# Patient Record
Sex: Male | Born: 1985
Health system: Southern US, Community
[De-identification: ages and names within clinical notes are randomized; demographics above are authoritative.]

## PROBLEM LIST (undated history)

## (undated) DIAGNOSIS — F32A Depression, unspecified: Secondary | ICD-10-CM

## (undated) DIAGNOSIS — J342 Deviated nasal septum: Secondary | ICD-10-CM

## (undated) DIAGNOSIS — F329 Major depressive disorder, single episode, unspecified: Secondary | ICD-10-CM

---
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
## 2006-04-10 ENCOUNTER — Ambulatory Visit (HOSPITAL_COMMUNITY): Admission: RE | Admit: 2006-04-10 | Discharge: 2006-04-10 | Payer: Self-pay | Admitting: Family Medicine

## 2013-03-02 ENCOUNTER — Telehealth: Payer: Self-pay | Admitting: Family Medicine

## 2013-03-02 MED ORDER — BUPROPION HCL ER (XL) 150 MG PO TB24: 300.0000 mg | ORAL_TABLET | Freq: Every day | ORAL | Status: DC

## 2013-03-02 NOTE — Telephone Encounter (Signed)
Patient needs a refill of his wellbutrin to [facility]

## 2013-06-05 ENCOUNTER — Other Ambulatory Visit: Payer: Self-pay | Admitting: Family Medicine

## 2013-06-14 ENCOUNTER — Encounter: Payer: Self-pay | Admitting: *Deleted

## 2013-06-16 ENCOUNTER — Ambulatory Visit (INDEPENDENT_AMBULATORY_CARE_PROVIDER_SITE_OTHER): Payer: BC Managed Care – PPO | Admitting: Family Medicine

## 2013-06-16 ENCOUNTER — Encounter: Payer: Self-pay | Admitting: Family Medicine

## 2013-06-16 VITALS — BP 110/60 | HR 88 | Ht 67.5 in | Wt 119.0 lb

## 2013-06-16 DIAGNOSIS — R109 Unspecified abdominal pain: Secondary | ICD-10-CM

## 2013-06-16 MED ORDER — BUPROPION HCL ER (XL) 300 MG PO TB24: 300.0000 mg | ORAL_TABLET | ORAL | Status: DC

## 2013-06-16 NOTE — Progress Notes (Signed)
  Subjective:    Patient ID: Thomas Page, male    DOB: 1986-05-25, 27 y.o.   MRN: [id number]  HPI Needs a refill on med. No other concerns. He states the medicine does help him stay focused. He still smokes. He also drinks alcohol. He states at times he drinks 6-12 cans in a day. He denies driving drunk. He knows he needs to cut back. He refuses any counseling or refuses any referral. Patient does not want to be on any medications. He states he is not suicidal. He denies any severe abdominal discomforts vomiting or diarrhea or bloody stools or dysuria PMH benign   Review of Systems See above    Objective:   Physical Exam Lungs are clear hearts regular abdomen soft no masses felt pulses normal skin warm dry       Assessment & Plan:  #1 depression-stable continue current medication. Refills given #2 alcohol misuse/abuse-patient was told that he needs to consider a treatment program or counseling he does not want to do either. Patient was counseled that continuing on the current path could cause serious health problems. #3 we will check some lab work to look at liver function. Patient followup in 6-12 months

## 2013-06-16 NOTE — Patient Instructions (Addendum)
Reduce alcohol use

## 2013-07-08 LAB — HEPATIC FUNCTION PANEL
ALT: 21 U/L (ref 0–53)
Total Protein: 7.6 g/dL (ref 6.0–8.3)

## 2013-07-08 LAB — CBC WITH DIFFERENTIAL/PLATELET
Basophils Absolute: 0.1 10*3/uL (ref 0.0–0.1)
Basophils Relative: 2 % — ABNORMAL HIGH (ref 0–1)
Eosinophils Relative: 1 % (ref 0–5)
HCT: 45.7 % (ref 39.0–52.0)
MCHC: 35 g/dL (ref 30.0–36.0)
MCV: 92.3 fL (ref 78.0–100.0)
Monocytes Absolute: 0.4 10*3/uL (ref 0.1–1.0)
Neutro Abs: 1.7 10*3/uL (ref 1.7–7.7)
RDW: 13.5 % (ref 11.5–15.5)

## 2014-07-26 ENCOUNTER — Other Ambulatory Visit: Payer: Self-pay | Admitting: Family Medicine

## 2014-07-26 NOTE — Telephone Encounter (Signed)
1 refill, needs office visit 

## 2014-07-26 NOTE — Telephone Encounter (Signed)
Patient not seen in over a year.

## 2014-09-04 ENCOUNTER — Other Ambulatory Visit: Payer: Self-pay | Admitting: Family Medicine

## 2014-09-06 ENCOUNTER — Other Ambulatory Visit: Payer: Self-pay | Admitting: *Deleted

## 2014-09-21 ENCOUNTER — Encounter: Payer: Self-pay | Admitting: Family Medicine

## 2014-09-21 ENCOUNTER — Ambulatory Visit (INDEPENDENT_AMBULATORY_CARE_PROVIDER_SITE_OTHER): Payer: BC Managed Care – PPO | Admitting: Family Medicine

## 2014-09-21 VITALS — BP 120/80 | Ht 67.5 in | Wt 118.1 lb

## 2014-09-21 DIAGNOSIS — M792 Neuralgia and neuritis, unspecified: Secondary | ICD-10-CM

## 2014-09-21 DIAGNOSIS — Z23 Encounter for immunization: Secondary | ICD-10-CM

## 2014-09-21 MED ORDER — BUPROPION HCL ER (XL) 300 MG PO TB24: ORAL_TABLET | ORAL | Status: DC

## 2014-09-21 NOTE — Progress Notes (Signed)
   Subjective:    Patient ID: Thomas Page, male    DOB: 20-Oct-1986, 28 y.o.   MRN: [id number]  HPI Patient is here today for his follow up visit on depression. Patient currently taking Wellbutrin XL 300 mg daily. Patient states he is doing very well. Patient states that he has a shooting pain in his right hand at times. This has been present for about 2-3 months now.  States overall his stress levels are doing all right he states that his symptoms are hanging in there he denies being depressed currently he wants to continue the medication he states it helps him  He gets intermittent sharp shooting pains sharp pains in the right hand no known injury.  Review of Systems    denies chest pain shortness of breath abdominal pain vomiting diarrhea rectal bleeding Objective:   Physical Exam  On examination lungs clear heart regular neck no masses abdomen soft      Assessment & Plan:  Significant stress does well with Wellbutrin. Tolerates medicine well.  Alcohol use-I counseled him regarding cutting back alcohol use and trying to quit he does not drink every day but he needs to cut back the amount he drinks on those days  Follow up in one years time he was counseled to quit smoking  Sharp pains in the hands I think this is probably a neuralgia if he gets worse nerve conduction studies it only occurs intermittently no weakness in the arms reflexes good no other in intervention or tests necessary currently.

## 2015-05-10 ENCOUNTER — Emergency Department (HOSPITAL_COMMUNITY)
Admission: EM | Admit: 2015-05-10 | Discharge: 2015-05-10 | Disposition: A | Discharge status: Home / Self Care | Payer: 59 | Attending: Emergency Medicine | Admitting: Emergency Medicine

## 2015-05-10 ENCOUNTER — Encounter (HOSPITAL_COMMUNITY): Payer: Self-pay | Admitting: *Deleted

## 2015-05-10 DIAGNOSIS — Y9389 Activity, other specified: Secondary | ICD-10-CM | POA: Insufficient documentation

## 2015-05-10 DIAGNOSIS — T1592XA Foreign body on external eye, part unspecified, left eye, initial encounter: Secondary | ICD-10-CM | POA: Diagnosis present

## 2015-05-10 DIAGNOSIS — Y9289 Other specified places as the place of occurrence of the external cause: Secondary | ICD-10-CM | POA: Diagnosis not present

## 2015-05-10 DIAGNOSIS — X58XXXA Exposure to other specified factors, initial encounter: Secondary | ICD-10-CM | POA: Diagnosis not present

## 2015-05-10 DIAGNOSIS — Z720 Tobacco use: Secondary | ICD-10-CM | POA: Insufficient documentation

## 2015-05-10 DIAGNOSIS — F329 Major depressive disorder, single episode, unspecified: Secondary | ICD-10-CM | POA: Insufficient documentation

## 2015-05-10 DIAGNOSIS — Z880 Allergy status to penicillin: Secondary | ICD-10-CM | POA: Diagnosis not present

## 2015-05-10 DIAGNOSIS — Y998 Other external cause status: Secondary | ICD-10-CM | POA: Insufficient documentation

## 2015-05-10 HISTORY — DX: Major depressive disorder, single episode, unspecified: F32.9

## 2015-05-10 HISTORY — DX: Depression, unspecified: F32.A

## 2015-05-10 MED ORDER — TETRACAINE HCL 0.5 % OP SOLN
1.0000 [drp] | Freq: Once | OPHTHALMIC | Status: AC
  Administered 2015-05-10: 1 [drp] via OPHTHALMIC
  Filled 2015-05-10: qty 2

## 2015-05-10 MED ORDER — FLUORESCEIN SODIUM 1 MG OP STRP
1.0000 | ORAL_STRIP | Freq: Once | OPHTHALMIC | Status: AC
  Administered 2015-05-10: 1 via OPHTHALMIC

## 2015-05-10 MED ORDER — TOBRAMYCIN 0.3 % OP SOLN
1.0000 [drp] | Freq: Once | OPHTHALMIC | Status: AC
  Administered 2015-05-10: 1 [drp] via OPHTHALMIC
  Filled 2015-05-10: qty 5

## 2015-05-10 NOTE — Discharge Instructions (Signed)
Return as needed for worsening symptoms or follow up with Dr. Lita MainsHaines.

## 2015-05-10 NOTE — ED Provider Notes (Signed)
CSN: [id number]     Arrival date & time 05/10/15  2003 History   First MD Initiated Contact with Patient 05/10/15 2115     Chief Complaint  Patient presents with  . Foreign Body in Eye     (Consider location/radiation/quality/duration/timing/severity/associated sxs/prior Treatment) Patient is a 29 y.o. male presenting with foreign body in eye. The history is provided by the patient.  Foreign Body in Eye This is a new problem. The current episode started today. The problem occurs constantly. The problem has been unchanged.   Thomas Page is a 29 y.o. male who presents to the ED with left eye foreign body. He states that he was mowing and something flew up into his left eye. He tried flushing it out with water several times without relief. Patient reports that after arrival to the ED his eye was watering a lot and then it felt like what ever was in the eye came out and now he does not have pain but wanted to be sure he didn't have a scratch or anything in it now.   Past Medical History  Diagnosis Date  . Depression    No past surgical history on file. No family history on file. History  Substance Use Topics  . Smoking status: Light Tobacco Smoker  . Smokeless tobacco: Not on file  . Alcohol Use: Yes    Review of Systems Negative except as stated in HPI   Allergies  Amoxil and Penicillins  Home Medications   Prior to Admission medications   Medication Sig Start Date End Date Taking? Authorizing Provider  buPROPion (WELLBUTRIN XL) 300 MG 24 hr tablet TAKE 1 TABLET BY MOUTH EVERY MORNING. 09/21/14   Babs SciaraScott A Luking, MD   BP 157/88 mmHg  Pulse 67  Temp(Src) 98.2 F (36.8 C) (Oral)  Resp 15  Ht 5\' 9"  (1.753 m)  Wt 120 lb (54.432 kg)  BMI 17.71 kg/m2  SpO2 99% Physical Exam  Constitutional: He is oriented to person, place, and time. He appears well-developed and well-nourished.  HENT:  Head: Normocephalic.  Eyes: EOM are normal. Pupils are equal, round, and reactive  to light. Left eye exhibits no discharge and no exudate. No foreign body present in the left eye. Left conjunctiva is injected.  Fundoscopic exam:      The left eye shows no exudate.  Slit lamp exam:      The left eye shows no corneal abrasion, no corneal ulcer, no foreign body and no fluorescein uptake.  Neck: Neck supple.  Cardiovascular: Normal rate.   Pulmonary/Chest: Effort normal.  Musculoskeletal: Normal range of motion.  Neurological: He is alert and oriented to person, place, and time. No cranial nerve deficit.  Skin: Skin is warm and dry.  Psychiatric: He has a normal mood and affect. His behavior is normal.  Nursing note and vitals reviewed.   ED Course  Procedures (including critical care time) Tetracaine opth, visual acuity, slit lamp exam, eye stained, tobramycin opth. Drops.  Labs Review  MDM  29 y.o. male with left eye irritation after something blew in his eye while mowing. Stable for d/c with symptoms resolving after his eye started watering a lot. No foreign body visualized and no corneal abrasion identified on exam. Patient to follow up with Dr. Lita MainsHaines if symptoms return.   Final diagnoses:  Foreign body of left eye, initial encounter      [facility] Rollinson, NP 05/10/15 2139  Zadie Rhineonald Wickline, MD 05/11/15 [id number]

## 2015-05-10 NOTE — ED Notes (Signed)
Pt states he was mowing and something flew up in his left eye. Pt state he has flushed this eye multiple times with no relief.

## 2015-10-18 ENCOUNTER — Other Ambulatory Visit: Payer: Self-pay | Admitting: Family Medicine

## 2015-10-19 NOTE — Telephone Encounter (Signed)
May have this and one refill please send patient a card he needs office visit

## 2015-10-20 ENCOUNTER — Other Ambulatory Visit: Payer: Self-pay

## 2015-11-23 ENCOUNTER — Other Ambulatory Visit: Payer: Self-pay | Admitting: Family Medicine

## 2015-11-23 NOTE — Telephone Encounter (Signed)
This and one refill, patient needs office visit

## 2016-02-08 ENCOUNTER — Other Ambulatory Visit: Payer: Self-pay | Admitting: Family Medicine

## 2016-02-09 NOTE — Telephone Encounter (Signed)
Not seen for one and ahalf years. CALL pt plz. Thirty days worth. Needs appt with Lorin PicketScott

## 2016-02-21 ENCOUNTER — Encounter: Payer: Self-pay | Admitting: Family Medicine

## 2016-02-21 ENCOUNTER — Ambulatory Visit (INDEPENDENT_AMBULATORY_CARE_PROVIDER_SITE_OTHER): Payer: BLUE CROSS/BLUE SHIELD | Admitting: Family Medicine

## 2016-02-21 VITALS — BP 134/84 | Ht 67.5 in | Wt 120.0 lb

## 2016-02-21 DIAGNOSIS — F418 Other specified anxiety disorders: Secondary | ICD-10-CM | POA: Diagnosis not present

## 2016-02-21 DIAGNOSIS — Z139 Encounter for screening, unspecified: Secondary | ICD-10-CM | POA: Diagnosis not present

## 2016-02-21 DIAGNOSIS — J309 Allergic rhinitis, unspecified: Secondary | ICD-10-CM

## 2016-02-21 MED ORDER — BUPROPION HCL ER (XL) 300 MG PO TB24: 300.0000 mg | ORAL_TABLET | Freq: Every morning | ORAL | Status: DC

## 2016-02-21 NOTE — Progress Notes (Signed)
   Subjective:    Patient ID: Thomas Page, male    DOB: 06-17-1986, 30 y.o.   MRN: [id number]  Depression        This is a recurrent problem.  The current episode started more than 1 year ago.   Treatments tried: Wellbutrin.  Patient has concerns of wax build up to bilateral ears. Some allergy issues. Not severe. Patient states moods overall doing well would like to continue Wellbutrin   Review of Systems  Psychiatric/Behavioral: Positive for depression.   Patient denies chest tightness pressure pain shortness breath vomiting diarrhea    Objective:   Physical Exam  lungs clear heart regular pulse normal BP good eardrums appear normal      Assessment & Plan:  Depression with anxiety patient overall doing well with medication. States he is functioning well with it would like to continue  Patient does use alcohol on a regular basis I cautioned him and encouraged him try to maintain at a very low amount per day at most.  Screening lab work ordered ordered

## 2016-02-27 DIAGNOSIS — Z139 Encounter for screening, unspecified: Secondary | ICD-10-CM | POA: Diagnosis not present

## 2016-02-28 ENCOUNTER — Encounter: Payer: Self-pay | Admitting: Family Medicine

## 2016-02-28 LAB — LIPID PANEL
Chol/HDL Ratio: 2.6 ratio units (ref 0.0–5.0)
Cholesterol, Total: 198 mg/dL (ref 100–199)
HDL: 77 mg/dL (ref >39)
LDL Calculated: 104 mg/dL — ABNORMAL HIGH (ref 0–99)
TRIGLYCERIDES: 85 mg/dL (ref 0–149)
VLDL Cholesterol Cal: 17 mg/dL (ref 5–40)

## 2016-02-28 LAB — HEPATIC FUNCTION PANEL
ALBUMIN: 5 g/dL (ref 3.5–5.5)
ALT: 14 IU/L (ref 0–44)
AST: 15 IU/L (ref 0–40)
Alkaline Phosphatase: 71 IU/L (ref 39–117)
BILIRUBIN TOTAL: 0.8 mg/dL (ref 0.0–1.2)
BILIRUBIN, DIRECT: 0.19 mg/dL (ref 0.00–0.40)
Total Protein: 7.2 g/dL (ref 6.0–8.5)

## 2016-02-28 LAB — GLUCOSE, RANDOM: GLUCOSE: 95 mg/dL (ref 65–99)

## 2016-08-28 DIAGNOSIS — Z23 Encounter for immunization: Secondary | ICD-10-CM | POA: Diagnosis not present

## 2017-03-19 ENCOUNTER — Other Ambulatory Visit: Payer: Self-pay | Admitting: Family Medicine

## 2017-03-26 ENCOUNTER — Ambulatory Visit (INDEPENDENT_AMBULATORY_CARE_PROVIDER_SITE_OTHER): Payer: BLUE CROSS/BLUE SHIELD | Admitting: Family Medicine

## 2017-03-26 ENCOUNTER — Encounter: Payer: Self-pay | Admitting: Family Medicine

## 2017-03-26 VITALS — BP 130/90 | Ht 68.0 in | Wt 121.0 lb

## 2017-03-26 DIAGNOSIS — E784 Other hyperlipidemia: Secondary | ICD-10-CM | POA: Diagnosis not present

## 2017-03-26 DIAGNOSIS — F418 Other specified anxiety disorders: Secondary | ICD-10-CM | POA: Diagnosis not present

## 2017-03-26 DIAGNOSIS — M79671 Pain in right foot: Secondary | ICD-10-CM | POA: Diagnosis not present

## 2017-03-26 DIAGNOSIS — Z79899 Other long term (current) drug therapy: Secondary | ICD-10-CM

## 2017-03-26 DIAGNOSIS — M79672 Pain in left foot: Secondary | ICD-10-CM | POA: Diagnosis not present

## 2017-03-26 DIAGNOSIS — E7849 Other hyperlipidemia: Secondary | ICD-10-CM

## 2017-03-26 MED ORDER — BUPROPION HCL ER (XL) 300 MG PO TB24: 300.0000 mg | ORAL_TABLET | Freq: Every morning | ORAL | 12 refills | Status: DC

## 2017-03-26 NOTE — Progress Notes (Signed)
   Subjective:    Patient ID: Thomas Page, male    DOB: [birth year], 31 y.o.   MRN: [id number]  Depression         This is a chronic problem.  The current episode started more than 1 month ago.  Patient states his depression under good control he does relate feeling stressed at times facing some decisions with his work denies excessive anxiety not suicidal he does drink alcohol 2 or 3 days a week some days more than others denies abusing it.  Patient states no other concerns this visit.  Review of Systems  Psychiatric/Behavioral: Positive for depression.       Objective:   Physical Exam Lungs clear heart regular pulse normal extremities no edema  I discussed alcohol abuse with the patient encouraged him to avoid alcohol use at the very least cut back to no more than 1 or 2 drinks per day     Assessment & Plan:  Depression stable on medicine the medicine benefits the patient he would like to continue with one additional year given follow-up in one year  Mild lipid issue in the past importance for the patient to watch diet check lab work  Patient does overuse alcohol intermittently he was counseled to cut back check liver function  Follow-up in one year

## 2017-04-01 ENCOUNTER — Encounter: Payer: Self-pay | Admitting: Family Medicine

## 2017-04-03 DIAGNOSIS — E784 Other hyperlipidemia: Secondary | ICD-10-CM | POA: Diagnosis not present

## 2017-04-03 DIAGNOSIS — Z79899 Other long term (current) drug therapy: Secondary | ICD-10-CM | POA: Diagnosis not present

## 2017-04-04 ENCOUNTER — Encounter: Payer: Self-pay | Admitting: Family Medicine

## 2017-04-04 LAB — HEPATIC FUNCTION PANEL
ALT: 17 IU/L (ref 0–44)
AST: 20 IU/L (ref 0–40)
Albumin: 4.8 g/dL (ref 3.5–5.5)
Alkaline Phosphatase: 69 IU/L (ref 39–117)
BILIRUBIN TOTAL: 0.7 mg/dL (ref 0.0–1.2)
BILIRUBIN, DIRECT: 0.16 mg/dL (ref 0.00–0.40)
Total Protein: 7 g/dL (ref 6.0–8.5)

## 2017-04-04 LAB — LIPID PANEL
CHOLESTEROL TOTAL: 189 mg/dL (ref 100–199)
Chol/HDL Ratio: 2.9 ratio (ref 0.0–5.0)
HDL: 65 mg/dL (ref >39)
LDL CALC: 102 mg/dL — AB (ref 0–99)
TRIGLYCERIDES: 110 mg/dL (ref 0–149)
VLDL Cholesterol Cal: 22 mg/dL (ref 5–40)

## 2017-04-04 LAB — GLUCOSE, RANDOM: GLUCOSE: 98 mg/dL (ref 65–99)

## 2017-04-30 DIAGNOSIS — M2042 Other hammer toe(s) (acquired), left foot: Secondary | ICD-10-CM | POA: Diagnosis not present

## 2017-04-30 DIAGNOSIS — M2041 Other hammer toe(s) (acquired), right foot: Secondary | ICD-10-CM | POA: Diagnosis not present

## 2017-04-30 DIAGNOSIS — M79674 Pain in right toe(s): Secondary | ICD-10-CM | POA: Diagnosis not present

## 2017-07-19 ENCOUNTER — Other Ambulatory Visit (HOSPITAL_COMMUNITY): Payer: Self-pay

## 2017-08-13 ENCOUNTER — Other Ambulatory Visit: Payer: Self-pay | Admitting: Podiatry

## 2017-08-13 NOTE — Patient Instructions (Signed)
Thomas Page  08/13/2017     @   Your procedure is scheduled on  08/21/2017 .  Report to [facility] at  700  A.M.  Call this number if you have problems the morning of surgery:  [phone]   Remember:  Do not eat food or drink liquids after midnight.  Take these medicines the morning of surgery with A SIP OF WATER  Wellbutrin, claritin.   Do not wear jewelry, make-up or nail polish.  Do not wear lotions, powders, or perfumes, or deoderant.  Do not shave 48 hours prior to surgery.  Men may shave face and neck.  Do not bring valuables to the hospital.  [facility] is not responsible for any belongings or valuables.  Contacts, dentures or bridgework may not be worn into surgery.  Leave your suitcase in the car.  After surgery it may be brought to your room.  For patients admitted to the hospital, discharge time will be determined by your treatment team.  Patients discharged the day of surgery will not be allowed to drive home.   Name and phone number of your driver:   family Special instructions:  None  Please read over the following fact sheets that you were given. Anesthesia Post-op Instructions and Care and Recovery After Surgery      Toe Deformity Repair Toe deformity repair is a surgical procedure to reposition a toe that stays bent in an unnatural position. It is done so that the toe no longer causes pain or interferes with walking. This surgery may be done if other treatments have not helped. Tell a health care provider about:  Any allergies you have.  All medicines you are taking, including vitamins, herbs, eye drops, creams, and over-the-counter medicines.  Any problems you or family members have had with anesthetic medicines.  Any blood disorders you have.  Any surgeries you have had.  Any medical conditions you have. What are the risks? Generally, this is a safe procedure. However, problems may occur,  including:  Bleeding.  Swelling.  Infection.  Pain.  Numbness.  Scarring.  Nerve injury.  Poor toe alignment.  What happens before the procedure?  Ask your health care provider about: ? Changing or stopping your regular medicines. This is especially important if you are taking diabetes medicines or blood thinners. ? Taking medicines such as aspirin and ibuprofen. These medicines can thin your blood. Do not take these medicines before your procedure if your health care provider instructs you not to.  Follow your health care provider's instructions about eating or drinking restrictions.  Plan to have someone take you home after the procedure. What happens during the procedure?  An IV will be started in one of your veins.  You will be given one of the following: ? A medicine that numbs the area (local anesthetic). The medicine will be injected directly into your toe. If you are given a local anesthetic, you may also be given a medicine that makes you relax (sedative). ? A medicine that makes you fall asleep (general anesthetic).  One or more incisions will be made on your affected toe.  If extra bone or tissue is causing the deformity, it will be removed.  If connective tissues such as tendons or ligaments are causing the deformity, they will be removed or relocated.  Surgical pins or screws will be placed to hold your toe in place during the healing process.  After  the procedure, the incisions will be closed with stitches (sutures).  A bandage (dressing) will be placed over the incision area. The procedure may vary among health care providers and hospitals. What happens after the procedure?  You will stay in a recovery area until the anesthesia wears off.  Your blood pressure, heart rate, breathing rate, and blood oxygen level will be monitored regularly.  It is normal to have some pain. You will be given pain medicine as needed. This information is not intended to  replace advice given to you by your health care provider. Make sure you discuss any questions you have with your health care provider. Document Released: 10/26/2000 Document Revised: 07/02/2016 Document Reviewed: 07/14/2014 Elsevier Interactive Patient Education  2018 [employer] Toe Deformity Repair, Care After Refer to this sheet in the next few weeks. These instructions provide you with information on caring for yourself after your procedure. Your health care provider may also give you more specific instructions. Your treatment has been planned according to current medical practices, but problems sometimes occur. Call your health care provider if you have any problems or questions after your procedure. What can I expect after the procedure? After the procedure, it is common to have pain in the affected area. Follow these instructions at home:  Take medicines only as directed by your health care provider.  Do not drive or operate heavy machinery while taking pain medicine.  You may resume your normal activities as allowed by your health care provider.  Do not take baths, swim, or use a hot tub until your health care provider approves.  If you have a surgical shoe, wear it as directed by your health care provider. Remove it only as directed by your health care provider.  If you have a splint: ? Wear it as directed by your health care provider. Remove it only as directed by your health care provider. ? Loosen it if your toes become numb and tingle, or if they turn cold and blue. ? Cover it with a watertight plastic bag to protect it from water while you take a bath or a shower. Do not let it get wet.  There are many different ways to close and cover an incision, including stitches, skin glue, and adhesive strips. Follow your health care provider's instructions about: ? Incision care. ? Bandage (dressing) changes and removal. ? Incision closure removal.  Check your incision area every  day for signs of infection. Watch for: ? Redness, swelling, or pain. ? Fluid, blood, or pus. Contact a health care provider if:  You have redness, swelling, or pain at your incision site.  You have blood, fluid, or pus coming from your incision.  You notice a bad smell coming from the incision area or the dressing.  You have a fever. Get help right away if:  You develop a rash.  You have difficulty breathing. This information is not intended to replace advice given to you by your health care provider. Make sure you discuss any questions you have with your health care provider. Document Released: 05/18/2005 Document Revised: 07/02/2016 Document Reviewed: 07/14/2014 Elsevier Interactive Patient Education  2018 [employer].  General Anesthesia, Adult General anesthesia is the use of medicines to make a person "go to sleep" (be unconscious) for a medical procedure. General anesthesia is often recommended when a procedure:  Is long.  Requires you to be still or in an unusual position.  Is major and can cause you to lose blood.  Is impossible  to do without general anesthesia.  The medicines used for general anesthesia are called general anesthetics. In addition to making you sleep, the medicines:  Prevent pain.  Control your blood pressure.  Relax your muscles.  Tell a health care provider about:  Any allergies you have.  All medicines you are taking, including vitamins, herbs, eye drops, creams, and over-the-counter medicines.  Any problems you or family members have had with anesthetic medicines.  Types of anesthetics you have had in the past.  Any bleeding disorders you have.  Any surgeries you have had.  Any medical conditions you have.  Any history of heart or lung conditions, such as heart failure, sleep apnea, or chronic obstructive pulmonary disease (COPD).  Whether you are pregnant or may be pregnant.  Whether you use tobacco, alcohol, marijuana, or  street drugs.  Any history of Financial planner.  Any history of depression or anxiety. What are the risks? Generally, this is a safe procedure. However, problems may occur, including:  Allergic reaction to anesthetics.  Lung and heart problems.  Inhaling food or liquids from your stomach into your lungs (aspiration).  Injury to nerves.  Waking up during your procedure and being unable to move (rare).  Extreme agitation or a state of mental confusion (delirium) when you wake up from the anesthetic.  Air in the bloodstream, which can lead to stroke.  These problems are more likely to develop if you are having a major surgery or if you have an advanced medical condition. You can prevent some of these complications by answering all of your health care provider's questions thoroughly and by following all pre-procedure instructions. General anesthesia can cause side effects, including:  Nausea or vomiting  A sore throat from the breathing tube.  Feeling cold or shivery.  Feeling tired, washed out, or achy.  Sleepiness or drowsiness.  Confusion or agitation.  What happens before the procedure? Staying hydrated Follow instructions from your health care provider about hydration, which may include:  Up to 2 hours before the procedure - you may continue to drink clear liquids, such as water, clear fruit juice, black coffee, and plain tea.  Eating and drinking restrictions Follow instructions from your health care provider about eating and drinking, which may include:  8 hours before the procedure - stop eating heavy meals or foods such as meat, fried foods, or fatty foods.  6 hours before the procedure - stop eating light meals or foods, such as toast or cereal.  6 hours before the procedure - stop drinking milk or drinks that contain milk.  2 hours before the procedure - stop drinking clear liquids.  Medicines  Ask your health care provider about: ? Changing or stopping  your regular medicines. This is especially important if you are taking diabetes medicines or blood thinners. ? Taking medicines such as aspirin and ibuprofen. These medicines can thin your blood. Do not take these medicines before your procedure if your health care provider instructs you not to. ? Taking new dietary supplements or medicines. Do not take these during the week before your procedure unless your health care provider approves them.  If you are told to take a medicine or to continue taking a medicine on the day of the procedure, take the medicine with sips of water. General instructions   Ask if you will be going home the same day, the following day, or after a longer hospital stay. ? Plan to have someone take you home. ? Plan to have  someone stay with you for the first 24 hours after you leave the hospital or clinic.  For 3-6 weeks before the procedure, try not to use any tobacco products, such as cigarettes, chewing tobacco, and e-cigarettes.  You may brush your teeth on the morning of the procedure, but make sure to spit out the toothpaste. What happens during the procedure?  You will be given anesthetics through a mask and through an IV tube in one of your veins.  You may receive medicine to help you relax (sedative).  As soon as you are asleep, a breathing tube may be used to help you breathe.  An anesthesia specialist will stay with you throughout the procedure. He or she will help keep you comfortable and safe by continuing to give you medicines and adjusting the amount of medicine that you get. He or she will also watch your blood pressure, pulse, and oxygen levels to make sure that the anesthetics do not cause any problems.  If a breathing tube was used to help you breathe, it will be removed before you wake up. The procedure may vary among health care providers and hospitals. What happens after the procedure?  You will wake up, often slowly, after the procedure is  complete, usually in a recovery area.  Your blood pressure, heart rate, breathing rate, and blood oxygen level will be monitored until the medicines you were given have worn off.  You may be given medicine to help you calm down if you feel anxious or agitated.  If you will be going home the same day, your health care provider may check to make sure you can stand, drink, and urinate.  Your health care providers will treat your pain and side effects before you go home.  Do not drive for 24 hours if you received a sedative.  You may: ? Feel nauseous and vomit. ? Have a sore throat. ? Have mental slowness. ? Feel cold or shivery. ? Feel sleepy. ? Feel tired. ? Feel sore or achy, even in parts of your body where you did not have surgery. This information is not intended to replace advice given to you by your health care provider. Make sure you discuss any questions you have with your health care provider. Document Released: 02/05/2008 Document Revised: 04/10/2016 Document Reviewed: 10/13/2015 Elsevier Interactive Patient Education  2018 [employer]. General Anesthesia, Adult, Care After These instructions provide you with information about caring for yourself after your procedure. Your health care provider may also give you more specific instructions. Your treatment has been planned according to current medical practices, but problems sometimes occur. Call your health care provider if you have any problems or questions after your procedure. What can I expect after the procedure? After the procedure, it is common to have:  Vomiting.  A sore throat.  Mental slowness.  It is common to feel:  Nauseous.  Cold or shivery.  Sleepy.  Tired.  Sore or achy, even in parts of your body where you did not have surgery.  Follow these instructions at home: For at least 24 hours after the procedure:  Do not: ? Participate in activities where you could fall or become  injured. ? Drive. ? Use heavy machinery. ? Drink alcohol. ? Take sleeping pills or medicines that cause drowsiness. ? Make important decisions or sign legal documents. ? Take care of children on your own.  Rest. Eating and drinking  If you vomit, drink water, juice, or soup when you can drink  without vomiting.  Drink enough fluid to keep your urine clear or pale yellow.  Make sure you have little or no nausea before eating solid foods.  Follow the diet recommended by your health care provider. General instructions  Have a responsible adult stay with you until you are awake and alert.  Return to your normal activities as told by your health care provider. Ask your health care provider what activities are safe for you.  Take over-the-counter and prescription medicines only as told by your health care provider.  If you smoke, do not smoke without supervision.  Keep all follow-up visits as told by your health care provider. This is important. Contact a health care provider if:  You continue to have nausea or vomiting at home, and medicines are not helpful.  You cannot drink fluids or start eating again.  You cannot urinate after 8-12 hours.  You develop a skin rash.  You have fever.  You have increasing redness at the site of your procedure. Get help right away if:  You have difficulty breathing.  You have chest pain.  You have unexpected bleeding.  You feel that you are having a life-threatening or urgent problem. This information is not intended to replace advice given to you by your health care provider. Make sure you discuss any questions you have with your health care provider. Document Released: 02/04/2001 Document Revised: 04/02/2016 Document Reviewed: 10/13/2015 Elsevier Interactive Patient Education  [employer].

## 2017-08-16 ENCOUNTER — Ambulatory Visit (HOSPITAL_COMMUNITY)
Admission: RE | Admit: 2017-08-16 | Discharge: 2017-08-16 | Disposition: A | Discharge status: Home / Self Care | Payer: BLUE CROSS/BLUE SHIELD | Source: Ambulatory Visit | Attending: Podiatry | Admitting: Podiatry

## 2017-08-16 ENCOUNTER — Encounter (HOSPITAL_COMMUNITY): Payer: Self-pay

## 2017-08-16 ENCOUNTER — Encounter (HOSPITAL_COMMUNITY)
Admission: RE | Admit: 2017-08-16 | Discharge: 2017-08-16 | Disposition: A | Discharge status: Home / Self Care | Payer: BLUE CROSS/BLUE SHIELD | Source: Ambulatory Visit | Attending: Podiatry | Admitting: Podiatry

## 2017-08-16 DIAGNOSIS — M2041 Other hammer toe(s) (acquired), right foot: Secondary | ICD-10-CM | POA: Diagnosis not present

## 2017-08-16 DIAGNOSIS — M2042 Other hammer toe(s) (acquired), left foot: Secondary | ICD-10-CM | POA: Diagnosis not present

## 2017-08-16 DIAGNOSIS — M79674 Pain in right toe(s): Secondary | ICD-10-CM | POA: Diagnosis not present

## 2017-08-16 LAB — CBC WITH DIFFERENTIAL/PLATELET
Basophils Absolute: 0.1 10*3/uL (ref 0.0–0.1)
Basophils Relative: 2 %
EOS PCT: 1 %
Eosinophils Absolute: 0 10*3/uL (ref 0.0–0.7)
HEMATOCRIT: 44.2 % (ref 39.0–52.0)
Hemoglobin: 15.3 g/dL (ref 13.0–17.0)
LYMPHS ABS: 1.3 10*3/uL (ref 0.7–4.0)
LYMPHS PCT: 38 %
MCH: 32.3 pg (ref 26.0–34.0)
MCHC: 34.6 g/dL (ref 30.0–36.0)
MCV: 93.2 fL (ref 78.0–100.0)
Monocytes Absolute: 0.4 10*3/uL (ref 0.1–1.0)
Monocytes Relative: 12 %
Neutro Abs: 1.6 10*3/uL — ABNORMAL LOW (ref 1.7–7.7)
Neutrophils Relative %: 47 %
PLATELETS: 260 10*3/uL (ref 150–400)
RBC: 4.74 MIL/uL (ref 4.22–5.81)
RDW: 12.5 % (ref 11.5–15.5)
WBC: 3.3 10*3/uL — AB (ref 4.0–10.5)

## 2017-08-16 LAB — SURGICAL PCR SCREEN
MRSA, PCR: NEGATIVE
Staphylococcus aureus: NEGATIVE

## 2017-08-21 ENCOUNTER — Ambulatory Visit (HOSPITAL_COMMUNITY): Payer: BLUE CROSS/BLUE SHIELD | Admitting: Anesthesiology

## 2017-08-21 ENCOUNTER — Encounter (HOSPITAL_COMMUNITY): Payer: Self-pay | Admitting: *Deleted

## 2017-08-21 ENCOUNTER — Ambulatory Visit (HOSPITAL_COMMUNITY): Payer: BLUE CROSS/BLUE SHIELD

## 2017-08-21 ENCOUNTER — Ambulatory Visit (HOSPITAL_COMMUNITY)
Admission: RE | Admit: 2017-08-21 | Discharge: 2017-08-21 | Disposition: A | Discharge status: Home / Self Care | Payer: BLUE CROSS/BLUE SHIELD | Source: Ambulatory Visit | Attending: Podiatry | Admitting: Podiatry

## 2017-08-21 ENCOUNTER — Encounter (HOSPITAL_COMMUNITY)
Admission: RE | Disposition: A | Discharge status: Home / Self Care | Payer: Self-pay | Source: Ambulatory Visit | Attending: Podiatry

## 2017-08-21 DIAGNOSIS — M2041 Other hammer toe(s) (acquired), right foot: Secondary | ICD-10-CM | POA: Insufficient documentation

## 2017-08-21 DIAGNOSIS — M79671 Pain in right foot: Secondary | ICD-10-CM | POA: Diagnosis not present

## 2017-08-21 DIAGNOSIS — M2042 Other hammer toe(s) (acquired), left foot: Secondary | ICD-10-CM | POA: Insufficient documentation

## 2017-08-21 DIAGNOSIS — M79672 Pain in left foot: Secondary | ICD-10-CM | POA: Diagnosis not present

## 2017-08-21 DIAGNOSIS — M79674 Pain in right toe(s): Secondary | ICD-10-CM | POA: Diagnosis not present

## 2017-08-21 DIAGNOSIS — Z9889 Other specified postprocedural states: Secondary | ICD-10-CM

## 2017-08-21 HISTORY — PX: TOE ARTHROPLASTY: SHX6504

## 2017-08-21 SURGERY — ARTHROPLASTY, TOE
Anesthesia: Monitor Anesthesia Care | Site: Toe | Laterality: Bilateral

## 2017-08-21 MED ORDER — ONDANSETRON HCL 4 MG/2ML IJ SOLN
4.0000 mg | Freq: Once | INTRAMUSCULAR | Status: AC
  Administered 2017-08-21: 4 mg via INTRAVENOUS
  Filled 2017-08-21: qty 2

## 2017-08-21 MED ORDER — CLINDAMYCIN PHOSPHATE 600 MG/50ML IV SOLN
INTRAVENOUS | Status: AC
  Filled 2017-08-21: qty 50

## 2017-08-21 MED ORDER — FENTANYL CITRATE (PF) 100 MCG/2ML IJ SOLN: 25.0000 ug | INTRAMUSCULAR | Status: DC | PRN

## 2017-08-21 MED ORDER — FENTANYL CITRATE (PF) 100 MCG/2ML IJ SOLN
INTRAMUSCULAR | Status: AC
  Filled 2017-08-21: qty 2

## 2017-08-21 MED ORDER — LACTATED RINGERS IV SOLN
INTRAVENOUS | Status: DC
  Administered 2017-08-21: 08:00:00 via INTRAVENOUS

## 2017-08-21 MED ORDER — CLINDAMYCIN PHOSPHATE 600 MG/50ML IV SOLN
600.0000 mg | Freq: Once | INTRAVENOUS | Status: AC
  Administered 2017-08-21: 600 mg via INTRAVENOUS

## 2017-08-21 MED ORDER — MIDAZOLAM HCL 2 MG/2ML IJ SOLN
INTRAMUSCULAR | Status: AC
  Filled 2017-08-21: qty 2

## 2017-08-21 MED ORDER — PROPOFOL 10 MG/ML IV BOLUS
INTRAVENOUS | Status: DC | PRN
  Administered 2017-08-21 (×2): 20 mg via INTRAVENOUS
  Administered 2017-08-21 (×2): 10 mg via INTRAVENOUS

## 2017-08-21 MED ORDER — KETOROLAC TROMETHAMINE 30 MG/ML IJ SOLN
INTRAMUSCULAR | Status: AC
  Filled 2017-08-21: qty 1

## 2017-08-21 MED ORDER — LIDOCAINE HCL (PF) 1 % IJ SOLN
INTRAMUSCULAR | Status: AC
  Filled 2017-08-21: qty 5

## 2017-08-21 MED ORDER — BUPIVACAINE HCL (PF) 0.5 % IJ SOLN
INTRAMUSCULAR | Status: DC | PRN
  Administered 2017-08-21: 10 mL
  Administered 2017-08-21: 3 mL
  Administered 2017-08-21: 5 mL

## 2017-08-21 MED ORDER — BUPIVACAINE HCL (PF) 0.5 % IJ SOLN
INTRAMUSCULAR | Status: AC
  Filled 2017-08-21: qty 30

## 2017-08-21 MED ORDER — CHLORHEXIDINE GLUCONATE CLOTH 2 % EX PADS: 6.0000 | MEDICATED_PAD | Freq: Once | CUTANEOUS | Status: DC

## 2017-08-21 MED ORDER — PROPOFOL 10 MG/ML IV BOLUS
INTRAVENOUS | Status: AC
  Filled 2017-08-21: qty 20

## 2017-08-21 MED ORDER — KETOROLAC TROMETHAMINE 30 MG/ML IJ SOLN
30.0000 mg | Freq: Once | INTRAMUSCULAR | Status: AC
  Administered 2017-08-21: 30 mg via INTRAVENOUS

## 2017-08-21 MED ORDER — 0.9 % SODIUM CHLORIDE (POUR BTL) OPTIME
TOPICAL | Status: DC | PRN
  Administered 2017-08-21: 1000 mL

## 2017-08-21 MED ORDER — MIDAZOLAM HCL 5 MG/5ML IJ SOLN
INTRAMUSCULAR | Status: DC | PRN
  Administered 2017-08-21 (×2): 1 mg via INTRAVENOUS

## 2017-08-21 MED ORDER — MIDAZOLAM HCL 2 MG/2ML IJ SOLN
1.0000 mg | INTRAMUSCULAR | Status: AC
  Administered 2017-08-21: 2 mg via INTRAVENOUS

## 2017-08-21 MED ORDER — FENTANYL CITRATE (PF) 100 MCG/2ML IJ SOLN
25.0000 ug | Freq: Once | INTRAMUSCULAR | Status: AC
  Administered 2017-08-21: 25 ug via INTRAVENOUS

## 2017-08-21 MED ORDER — PROPOFOL 500 MG/50ML IV EMUL
INTRAVENOUS | Status: DC | PRN
  Administered 2017-08-21: 09:00:00 via INTRAVENOUS
  Administered 2017-08-21: 100 ug/kg/min via INTRAVENOUS

## 2017-08-21 SURGICAL SUPPLY — 46 items
BAG HAMPER (MISCELLANEOUS) ×2 IMPLANT
BANDAGE ELASTIC 4 LF NS (GAUZE/BANDAGES/DRESSINGS) ×4 IMPLANT
BANDAGE ESMARK 4X12 BL STRL LF (DISPOSABLE) ×2 IMPLANT
BENZOIN TINCTURE PRP APPL 2/3 (GAUZE/BANDAGES/DRESSINGS) ×2 IMPLANT
BLADE OSC/SAG 11.5X5.5X.38 (BLADE) ×2 IMPLANT
BLADE SURG 15 STRL LF DISP TIS (BLADE) ×3 IMPLANT
BLADE SURG 15 STRL SS (BLADE) ×3
BNDG CONFORM 2 STRL LF (GAUZE/BANDAGES/DRESSINGS) ×4 IMPLANT
BNDG ESMARK 4X12 BLUE STRL LF (DISPOSABLE) ×4
BNDG GAUZE ELAST 4 BULKY (GAUZE/BANDAGES/DRESSINGS) ×4 IMPLANT
BOOT STEPPER DURA LG (SOFTGOODS) IMPLANT
BOOT STEPPER DURA MED (SOFTGOODS) IMPLANT
BOOT STEPPER DURA SM (SOFTGOODS) IMPLANT
BOOT STEPPER DURA XLG (SOFTGOODS) IMPLANT
BUR FAST CUTTING (BURR) ×1
BUR SRGRND 54.5X2.4X8 (BURR) ×1 IMPLANT
BURR SRGRND 54.5X2.4X8 (BURR) ×1
CHLORAPREP W/TINT 26ML (MISCELLANEOUS) IMPLANT
CLOTH BEACON ORANGE TIMEOUT ST (SAFETY) ×2 IMPLANT
COVER LIGHT HANDLE STERIS (MISCELLANEOUS) ×4 IMPLANT
CUFF TOURNIQUET SINGLE 18IN (TOURNIQUET CUFF) ×4 IMPLANT
DECANTER SPIKE VIAL GLASS SM (MISCELLANEOUS) ×2 IMPLANT
DRAPE OEC MINIVIEW 54X84 (DRAPES) ×2 IMPLANT
DRSG ADAPTIC 3X8 NADH LF (GAUZE/BANDAGES/DRESSINGS) ×4 IMPLANT
ELECT REM PT RETURN 9FT ADLT (ELECTROSURGICAL) ×2
ELECTRODE REM PT RTRN 9FT ADLT (ELECTROSURGICAL) ×1 IMPLANT
GAUZE SPONGE 4X4 12PLY STRL (GAUZE/BANDAGES/DRESSINGS) ×4 IMPLANT
GLOVE BIO SURGEON STRL SZ7.5 (GLOVE) ×2 IMPLANT
GLOVE BIOGEL PI IND STRL 7.0 (GLOVE) ×2 IMPLANT
GLOVE BIOGEL PI IND STRL 7.5 (GLOVE) ×1 IMPLANT
GLOVE BIOGEL PI INDICATOR 7.0 (GLOVE) ×2
GLOVE BIOGEL PI INDICATOR 7.5 (GLOVE) ×1
GLOVE ECLIPSE 7.0 STRL STRAW (GLOVE) ×2 IMPLANT
GOWN STRL REUS W/TWL LRG LVL3 (GOWN DISPOSABLE) ×6 IMPLANT
KIT ROOM TURNOVER AP CYSTO (KITS) ×2 IMPLANT
MANIFOLD NEPTUNE II (INSTRUMENTS) ×2 IMPLANT
NEEDLE HYPO 27GX1-1/4 (NEEDLE) ×4 IMPLANT
NS IRRIG 1000ML POUR BTL (IV SOLUTION) ×2 IMPLANT
PACK BASIC LIMB (CUSTOM PROCEDURE TRAY) ×2 IMPLANT
PAD ARMBOARD 7.5X6 YLW CONV (MISCELLANEOUS) ×2 IMPLANT
RASP SM TEAR CROSS CUT (RASP) IMPLANT
SET BASIN LINEN APH (SET/KITS/TRAYS/PACK) ×2 IMPLANT
SPONGE LAP 18X18 X RAY DECT (DISPOSABLE) ×2 IMPLANT
SUT PROLENE 4 0 PS 2 18 (SUTURE) ×2 IMPLANT
SUT VIC AB 4-0 PS2 27 (SUTURE) ×2 IMPLANT
SYR CONTROL 10ML LL (SYRINGE) ×4 IMPLANT

## 2017-08-21 NOTE — Anesthesia Preprocedure Evaluation (Signed)
Anesthesia Evaluation  Patient identified by MRN, date of birth, ID band Patient awake    Reviewed: Allergy & Precautions, NPO status , Patient's Chart, lab work & pertinent test results  Airway Mallampati: I  TM Distance: >3 FB     Dental  (+) Teeth Intact   Pulmonary neg pulmonary ROS,    breath sounds clear to auscultation       Cardiovascular  Rhythm:Regular Rate:Normal     Neuro/Psych PSYCHIATRIC DISORDERS Anxiety Depression negative neurological ROS     GI/Hepatic negative GI ROS, Neg liver ROS,   Endo/Other  negative endocrine ROS  Renal/GU negative Renal ROS     Musculoskeletal   Abdominal   Peds  Hematology negative hematology ROS (+)   Anesthesia Other Findings   Reproductive/Obstetrics                             Anesthesia Physical Anesthesia Plan  ASA: II  Anesthesia Plan: MAC   Post-op Pain Management:    Induction: Intravenous  PONV Risk Score and Plan:   Airway Management Planned: Simple Face Mask  Additional Equipment:   Intra-op Plan:   Post-operative Plan:   Informed Consent: I have reviewed the patients History and Physical, chart, labs and discussed the procedure including the risks, benefits and alternatives for the proposed anesthesia with the patient or authorized representative who has indicated his/her understanding and acceptance.     Plan Discussed with:   Anesthesia Plan Comments:         Anesthesia Quick Evaluation

## 2017-08-21 NOTE — Addendum Note (Signed)
Addendum  created 08/21/17 1136 by Moshe Salisbury, CRNA   Sign clinical note

## 2017-08-21 NOTE — Brief Op Note (Signed)
BRIEF OPERATIVE NOTE  DATE OF PROCEDURE 08/21/2017  SURGEON Dallas Schimke, DPM  ASSISTANT SURGEON Delton See, DPM  ASSISTANT SURGEON: None  OR STAFF Circulator: Lizabeth Leyden, RN Scrub Person: Illene Silver, CST   PREOPERATIVE DIAGNOSIS 1.  Hammertoe deformity of the 4th digit, left foot 2.  Hammertoe deformity of the 4th digit, right foot  POSTOPERATIVE DIAGNOSIS Same  PROCEDURE 1.  Derotational arthroplasty of the 4th digit, left foot 2.  Derotational arthroplasty of the 4th digit, right foot ANESTHESIA Monitor Anesthesia Care   HEMOSTASIS Pneumatic ankle tourniquet set at 250 mmHg  ESTIMATED BLOOD LOSS Minimal (<5 cc)  MATERIALS USED None  INJECTABLES 0.5% Marcaine plain  PATHOLOGY None  COMPLICATIONS None

## 2017-08-21 NOTE — Op Note (Addendum)
OPERATIVE NOTE  DATE OF PROCEDURE 08/21/2017  SURGEON Dallas Schimke, DPM  ASSISTANT SURGEON Delton See, DPM  ASSISTANT SURGEON: None  OR STAFF Circulator: Lizabeth Leyden, RN Scrub Person: Illene Silver, CST   PREOPERATIVE DIAGNOSIS 1.  Hammertoe deformity of the 4th digit, left foot 2.  Hammertoe deformity of the 4th digit, right foot  POSTOPERATIVE DIAGNOSIS Same  PROCEDURE 1.  Derotational arthroplasty of the 4th digit, left foot 2.  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  DESCRIPTION OF THE PROCEDURE:  The patient was brought to the operating room and placed on the operative table in the supine position.  A pneumatic ankle tourniquet was applied to both lower extremities.  Following sedation, the surgical sites were anesthetized with 0.5% Marcaine plain.  Each foot was then prepped, scrubbed, and draped in the usual sterile technique.  The left foot was elevated, exsanguinated and the pneumatic ankle tourniquet inflated to 250 mmHg.    Attention was directed to the dorsal aspect of the left fourth toe where 2 converging semi-elliptical incisions were made in an oblique manner oriented from a proximal lateral to distal medial direction.  The skin was removed and passed from the operative field.  A transverse tenotomy was performed at the level of the distal interphalangeal joint.  The distal portion of the middle phalanx was resected.  The flexor tendon was identified and transected.  The toe was derotated.  The extensor tendon was reapproximated using 4-0 Vicryl.  The skin was reapproximated using 4-0 Prolene.  A sterile dressing the  left foot.  The pneumatic ankle tourniquet was deflated.  A prompt hyperemic response was noted to all digits of the left foot.  The right foot was elevated, exsanguinated and the pneumatic ankle tourniquet inflated to 250 mmHg.    Attention was directed to the dorsal aspect of the right fourth toe where 2 converging semielliptical incisions were made in an oblique manner oriented from a proximal lateral to distal medial direction.  The skin was removed and passed from the operative field.  A transverse tenotomy was performed at the level of the distal interphalangeal joint.  The distal portion of the middle phalanx was resected.  The flexor tendon was identified and transected.  The toe was derotated.  The extensor tendon was reapproximated using 4-0 Vicryl.  The skin was reapproximated using 4-0 Prolene.  A sterile dressing the right foot.  The pneumatic ankle tourniquet was deflated.  A prompt hyperemic response was noted to all digits of the right foot.   The patient tolerated the procedure well.  The patient was then transferred to PACU with vital signs stable and vascular status intact to all toes of both feet.

## 2017-08-21 NOTE — Anesthesia Postprocedure Evaluation (Addendum)
Anesthesia Post Note  Patient: Thomas Page  Procedure(s) Performed: DEROTATIONAL ARTHROPLASTY 4TH TOES LEFT AND RIGHT FOOT (Bilateral Toe)  Patient location during evaluation: Short Stay Anesthesia Type: MAC Level of consciousness: awake and alert and oriented Pain management: pain level controlled Vital Signs Assessment: post-procedure vital signs reviewed and stable Respiratory status: spontaneous breathing Cardiovascular status: blood pressure returned to baseline Postop Assessment: no apparent nausea or vomiting and adequate PO intake Anesthetic complications: no  Late entry   Last Vitals:  Vitals:   08/21/17 1030 08/21/17 1038  BP: 132/90 (!) 141/87  Pulse: 72 80  Resp: 18 18  Temp:  36.5 C  SpO2: 100% 100%    Last Pain:  Vitals:   08/21/17 1038  TempSrc: Oral                 Florabel Faulks

## 2017-08-21 NOTE — Transfer of Care (Signed)
Immediate Anesthesia Transfer of Care Note  Patient: Thomas Page  Procedure(s) Performed: DEROTATIONAL ARTHROPLASTY 4TH TOES LEFT AND RIGHT FOOT (Bilateral Toe)  Patient Location: PACU  Anesthesia Type:MAC  Level of Consciousness: awake, alert  and oriented  Airway & Oxygen Therapy: Patient Spontanous Breathing  Post-op Assessment: Report given to RN  Post vital signs: Reviewed and stable  Last Vitals:  Vitals:   08/21/17 0825 08/21/17 1001  BP: 133/89   Resp: (!) 23   Temp:  (P) 36.6 C  SpO2: 98%     Last Pain:  Vitals:   08/21/17 0720  TempSrc: Oral         Complications: No apparent anesthesia complications

## 2017-08-21 NOTE — H&P (Signed)
HISTORY AND PHYSICAL INTERVAL NOTE:  08/21/2017  8:13 AM  Thomas Page  has presented today for surgery, with the diagnosis of hammer toes 4th digits of both feet.  The various methods of treatment have been discussed with the patient.  No guarantees were given.  After consideration of risks, benefits and other options for treatment, the patient has consented to surgery.  I have reviewed the patients' chart and labs.    Patient Vitals for the past 24 hrs:  Temp Temp src Height Weight  08/21/17 0720 98.5 F (36.9 C) Oral - -  08/21/17 0719 - -  (1.727 m) 121 lb (54.9 kg)    A history and physical examination was performed in my office.  The patient was reexamined.  There have been no changes to this history and physical examination.  Dallas Schimke, DPM

## 2017-08-21 NOTE — Discharge Instructions (Signed)
These instructions will give you an idea of what to expect after surgery and how to manage issues that may arise before your first post op office visit.  Pain Management Pain is best managed by staying ahead of it. If pain gets out of control, it is difficult to get it back under control. Local anesthesia that lasts 6-8 hours is used to numb the foot and decrease pain.  For the best pain control, take the pain medication every 4 hours for the first 2 days post op. On the third day pain medication can be taken as needed.   Post Op Nausea Nausea is common after surgery, so it is managed proactively.  If prescribed, use the prescribed nausea medication regularly for the first 2 days post op.  Bandages Do not worry if there is blood on the bandage. What looks like a lot of blood on the bandage is actually a small amount. Blood on the dressing spreads out as it is absorbed by the gauze, the same way a drop of water spreads out on a paper towel.  If the bandages feel wet or dry, stiff and uncomfortable, call the office during office hours and we will schedule a time for you to have the bandage changed.  Unless you are specifically told otherwise, we will do the first bandage change in the office.  Keep your bandage dry. If the bandage becomes wet or soiled, notify the office and we will schedule a time to change the bandage.  Activity It is best to spend most of the first 2 days after surgery lying down with the foot elevated above the level of your heart. You may put weight on your heels while wearing the surgical shoes.   You may only get up to go to the restroom.  Driving Do not drive until you are able to respond in an emergency (i.e. slam on the brakes). This usually occurs after the bone has healed - 6 to 8 weeks.  Call the Office If you have a fever over 101F.  If you have increasing pain after the initial post op pain has settled down.  If you have increasing redness, swelling, or  drainage.  If you have any questions or concerns.

## 2017-08-22 ENCOUNTER — Encounter (HOSPITAL_COMMUNITY): Payer: Self-pay | Admitting: Podiatry

## 2018-04-15 DIAGNOSIS — L603 Nail dystrophy: Secondary | ICD-10-CM | POA: Diagnosis not present

## 2018-04-15 DIAGNOSIS — Z4889 Encounter for other specified surgical aftercare: Secondary | ICD-10-CM | POA: Diagnosis not present

## 2018-05-07 ENCOUNTER — Ambulatory Visit: Payer: BLUE CROSS/BLUE SHIELD | Admitting: Nurse Practitioner

## 2018-05-07 ENCOUNTER — Encounter: Payer: Self-pay | Admitting: Nurse Practitioner

## 2018-05-07 VITALS — BP 140/88 | Temp 98.4°F | Ht 68.0 in | Wt 120.0 lb

## 2018-05-07 DIAGNOSIS — L237 Allergic contact dermatitis due to plants, except food: Secondary | ICD-10-CM

## 2018-05-07 DIAGNOSIS — L255 Unspecified contact dermatitis due to plants, except food: Secondary | ICD-10-CM | POA: Diagnosis not present

## 2018-05-07 MED ORDER — PREDNISONE 20 MG PO TABS: ORAL_TABLET | ORAL | 0 refills | Status: DC

## 2018-05-07 MED ORDER — METHYLPREDNISOLONE ACETATE 40 MG/ML IJ SUSP
40.0000 mg | Freq: Once | INTRAMUSCULAR | Status: AC
  Administered 2018-05-07: 40 mg via INTRAMUSCULAR

## 2018-05-07 NOTE — Progress Notes (Signed)
Subjective: Presents for complaints of a possible poison oak/poison ivy rash on his face first noticed 2 days ago.  Had been cutting grass the day before and thinks that some leaves may have brushed his face.  Has had a history of this type of rash in the past.  Describes the area is itching and tender.  No fever.  No wheezing.  Concerned because it is around his eyelids.  No actual eye involvement, vision is fine.  Objective:   BP 140/88   Temp 98.4 F (36.9 C) (Oral)   Ht 5\' 8"  (1.727 m)   Wt 120 lb (54.4 kg)   BMI 18.25 kg/m  NAD.  Alert, oriented.  Confluent slightly raised moderately erythematous line noted along the face at the inner part of the left eyelid down to the maxillary area.  A few spots are noted on the upper eyelid and around the lateral part of the eye.  Conjunctiva is mildly injected bilaterally.  A few early papules are noted on his forehead.  Assessment:  Rhus dermatitis - Plan: methylPREDNISolone acetate (DEPO-MEDROL) injection 40 mg    Plan:   Meds ordered this encounter  Medications  . predniSONE (DELTASONE) 20 MG tablet    Sig: 3 po qd x 3 d then 2 po qd x 3 d then 1 po qd x 2 d    Dispense:  17 tablet    Refill:  0    Order Specific Question:   Supervising Provider    Answer:   Merlyn AlbertLUKING, WILLIAM S [[id number]]  . methylPREDNISolone acetate (DEPO-MEDROL) injection 40 mg   Antihistamines as directed.  Hydrocortisone 1% OTC to facial rash as directed.  Call back in 48 hours if no improvement, sooner if worse.

## 2018-06-24 ENCOUNTER — Ambulatory Visit: Payer: BLUE CROSS/BLUE SHIELD | Admitting: Family Medicine

## 2018-06-24 ENCOUNTER — Encounter: Payer: Self-pay | Admitting: Family Medicine

## 2018-06-24 VITALS — BP 120/82 | Ht 68.0 in | Wt 117.0 lb

## 2018-06-24 DIAGNOSIS — F411 Generalized anxiety disorder: Secondary | ICD-10-CM | POA: Insufficient documentation

## 2018-06-24 DIAGNOSIS — Z13220 Encounter for screening for lipoid disorders: Secondary | ICD-10-CM | POA: Diagnosis not present

## 2018-06-24 DIAGNOSIS — L659 Nonscarring hair loss, unspecified: Secondary | ICD-10-CM

## 2018-06-24 MED ORDER — BUPROPION HCL ER (XL) 300 MG PO TB24: 300.0000 mg | ORAL_TABLET | Freq: Every morning | ORAL | 12 refills | Status: DC

## 2018-06-24 NOTE — Progress Notes (Signed)
   Subjective:    Patient ID: Thomas Page, male    DOB: September 22, 1986, 32 y.o.   MRN: [id number]  Depression         The current episode started more than 1 year ago.   Associated symptoms include no fatigue and no headaches.  Pt here for one year follow up on anxiety and depression.  Patient takes medication on a regular basis does a good job with this States his moods overall are doing well Denies being depressed Does have some anxiety issues Uses alcohol intermittently but not daily Denies abusing alcohol  Patient states his hair is thinning he is concerned about this but not worried  Review of Systems  Constitutional: Negative for diaphoresis and fatigue.  HENT: Negative for congestion and rhinorrhea.   Respiratory: Negative for cough and shortness of breath.   Cardiovascular: Negative for chest pain and leg swelling.  Gastrointestinal: Negative for abdominal pain and diarrhea.  Skin: Negative for color change and rash.  Neurological: Negative for dizziness and headaches.  Psychiatric/Behavioral: Positive for depression. Negative for behavioral problems and confusion.       Objective:   Physical Exam  Constitutional: He appears well-nourished. No distress.  HENT:  Head: Normocephalic and atraumatic.  Eyes: Right eye exhibits no discharge. Left eye exhibits no discharge.  Neck: No tracheal deviation present.  Cardiovascular: Normal rate, regular rhythm and normal heart sounds.  No murmur heard. Pulmonary/Chest: Effort normal and breath sounds normal. No respiratory distress.  Musculoskeletal: He exhibits no edema.  Lymphadenopathy:    He has no cervical adenopathy.  Neurological: He is alert. Coordination normal.  Skin: Skin is warm and dry.  Psychiatric: He has a normal mood and affect. His behavior is normal.  Vitals reviewed.         Assessment & Plan:  Hair is thinning check TSH Depression anxiety under good control with medication patient would like to  stick with medicine Patient will try to minimize alcohol use Lab work ordered Follow-up in 1 year

## 2018-06-27 DIAGNOSIS — L659 Nonscarring hair loss, unspecified: Secondary | ICD-10-CM | POA: Diagnosis not present

## 2018-06-27 DIAGNOSIS — Z13220 Encounter for screening for lipoid disorders: Secondary | ICD-10-CM | POA: Diagnosis not present

## 2018-06-28 ENCOUNTER — Encounter: Payer: Self-pay | Admitting: Family Medicine

## 2018-06-28 LAB — LIPID PANEL
CHOL/HDL RATIO: 2.6 ratio (ref 0.0–5.0)
Cholesterol, Total: 165 mg/dL (ref 100–199)
HDL: 63 mg/dL (ref >39)
LDL CALC: 90 mg/dL (ref 0–99)
Triglycerides: 59 mg/dL (ref 0–149)
VLDL CHOLESTEROL CAL: 12 mg/dL (ref 5–40)

## 2018-06-28 LAB — TSH: TSH: 1.72 u[IU]/mL (ref 0.450–4.500)

## 2018-07-22 ENCOUNTER — Ambulatory Visit: Payer: BLUE CROSS/BLUE SHIELD | Admitting: Family Medicine

## 2018-07-22 ENCOUNTER — Encounter: Payer: Self-pay | Admitting: Family Medicine

## 2018-07-22 VITALS — BP 130/90 | Temp 99.7°F | Ht 68.0 in | Wt 116.4 lb

## 2018-07-22 DIAGNOSIS — J029 Acute pharyngitis, unspecified: Secondary | ICD-10-CM | POA: Diagnosis not present

## 2018-07-22 DIAGNOSIS — K529 Noninfective gastroenteritis and colitis, unspecified: Secondary | ICD-10-CM

## 2018-07-22 LAB — POCT RAPID STREP A (OFFICE): RAPID STREP A SCREEN: NEGATIVE

## 2018-07-22 NOTE — Progress Notes (Signed)
   Subjective:    Patient ID: Thomas Page, male    DOB: 03-03-1986, 32 y.o.   MRN: [id number]  Sore Throat   This is a new problem. The current episode started today. The maximum temperature recorded prior to his arrival was 101 - 101.9 F. The fever has been present for less than 1 day. Associated symptoms include diarrhea and headaches. Pertinent negatives include no coughing, shortness of breath or vomiting. Associated symptoms comments: fever. Treatments tried: Nyquil.   Sore throat, diarrhea, and fever started last night.  Reports some nasal congestion. Denies otalgia, cough, or shortness of breath.   5-6 episodes of diarrhea today, completely liquid.  No blood in stool. Took 2 doses of Nyquil to help sleep last night and pepto bismol today.  Able to keep cereal and milk down this morning. Drinking water. Urinating okay.   Results for orders placed or performed in visit on 07/22/18  POCT rapid strep A  Result Value Ref Range   Rapid Strep A Screen Negative Negative     Review of Systems  Constitutional: Positive for fever.  HENT: Positive for sore throat.   Respiratory: Negative for cough and shortness of breath.   Gastrointestinal: Positive for diarrhea. Negative for nausea and vomiting.  Musculoskeletal: Positive for myalgias.  Skin: Negative for rash.  Neurological: Positive for headaches.       Objective:   Physical Exam  Constitutional: He is oriented to person, place, and time. He appears well-developed and well-nourished. No distress.  HENT:  Head: Normocephalic and atraumatic.  Right Ear: Tympanic membrane normal.  Left Ear: Tympanic membrane normal.  Mouth/Throat: Uvula is midline and mucous membranes are normal. Posterior oropharyngeal erythema (mild) present.  Neck: Neck supple.  Cardiovascular: Normal rate, regular rhythm and normal heart sounds.  Pulmonary/Chest: Effort normal and breath sounds normal. No respiratory distress. He has no wheezes.  Abdominal:  Soft. Bowel sounds are increased. There is no tenderness.  Lymphadenopathy:    He has no cervical adenopathy.  Neurological: He is alert and oriented to person, place, and time.  Skin: Skin is warm and dry. No rash noted.  Psychiatric: He has a normal mood and affect.  Vitals reviewed.      Assessment & Plan:  Gastroenteritis  Sore throat - Plan: POCT rapid strep A, Grp A Strep  Symptoms are most likely d/t a viral illness. Symptomatic care with bland diet and clear liquids discussed, avoid dairy. May progress diet as tolerated. May take otc imodium and tylenol as needed for relief of symptoms. Discussed with patient that if diarrhea persists over the next few days we will need to see him back for stool testing. F/u if symptoms worsen or fail to improve.   As attending physician to this patient visit, this patient was seen in conjunction with the nurse practitioner.  The history,physical and treatment plan was reviewed with the nurse practitioner and pertinent findings were verified with the patient.  Also the treatment plan was reviewed with the patient while they were present.

## 2018-07-22 NOTE — Patient Instructions (Addendum)
May take over the counter acetaminophen as needed for fever. May take imodium, 2 tablets, now, and then again with next episode of diarrhea, no more than 4 times in a day.  Bland Diet A bland diet consists of foods that do not have a lot of fat or fiber. Foods without fat or fiber are easier for the body to digest. They are also less likely to irritate your mouth, throat, stomach, and other parts of your gastrointestinal tract. A bland diet is sometimes called a BRAT diet. What is my plan? Your health care provider or dietitian may recommend specific changes to your diet to prevent and treat your symptoms, such as:  Eating small meals often.  Cooking food until it is soft enough to chew easily.  Chewing your food well.  Drinking fluids slowly.  Not eating foods that are very spicy, sour, or fatty.  Not eating citrus fruits, such as oranges and grapefruit.  What do I need to know about this diet?  Eat a variety of foods from the bland diet food list.  Do not follow a bland diet longer than you have to.  Ask your health care provider whether you should take vitamins. What foods can I eat? Grains  Hot cereals, such as cream of wheat. Bread, crackers, or tortillas made from refined white flour. Rice. Vegetables Canned or cooked vegetables. Mashed or boiled potatoes. Fruits Bananas. Applesauce. Other types of cooked or canned fruit with the skin and seeds removed, such as canned peaches or pears. Meats and Other Protein Sources Scrambled eggs. Creamy peanut butter or other nut butters. Lean, well-cooked meats, such as chicken or fish. Tofu. Soups or broths. Dairy Low-fat dairy products, such as milk, cottage cheese, or yogurt. Beverages Water. Herbal tea. Apple juice. Sweets and Desserts Pudding. Custard. Fruit gelatin. Ice cream. Fats and Oils Mild salad dressings. Canola or olive oil. The items listed above may not be a complete list of allowed foods or beverages. Contact  your dietitian for more options. What foods are not recommended? Foods and ingredients that are often not recommended include:  Spicy foods, such as hot sauce or salsa.  Fried foods.  Sour foods, such as pickled or fermented foods.  Raw vegetables or fruits, especially citrus or berries.  Caffeinated drinks.  Alcohol.  Strongly flavored seasonings or condiments.  The items listed above may not be a complete list of foods and beverages that are not allowed. Contact your dietitian for more information. This information is not intended to replace advice given to you by your health care provider. Make sure you discuss any questions you have with your health care provider. Document Released: 02/20/2016 Document Revised: 04/05/2016 Document Reviewed: 11/10/2014 Elsevier Interactive Patient Education  2018 [employer].

## 2018-07-23 LAB — STREP A DNA PROBE: Strep Gp A Direct, DNA Probe: NEGATIVE

## 2019-04-14 DIAGNOSIS — L603 Nail dystrophy: Secondary | ICD-10-CM | POA: Diagnosis not present

## 2019-04-14 DIAGNOSIS — Z4889 Encounter for other specified surgical aftercare: Secondary | ICD-10-CM | POA: Diagnosis not present

## 2019-07-02 ENCOUNTER — Other Ambulatory Visit: Payer: Self-pay | Admitting: Family Medicine

## 2019-07-03 NOTE — Telephone Encounter (Signed)
One mo, on it write needs appt before further,

## 2019-08-07 ENCOUNTER — Other Ambulatory Visit: Payer: Self-pay

## 2019-08-08 ENCOUNTER — Other Ambulatory Visit (INDEPENDENT_AMBULATORY_CARE_PROVIDER_SITE_OTHER): Payer: BC Managed Care – PPO | Admitting: *Deleted

## 2019-08-08 DIAGNOSIS — Z23 Encounter for immunization: Secondary | ICD-10-CM

## 2019-08-18 ENCOUNTER — Ambulatory Visit (INDEPENDENT_AMBULATORY_CARE_PROVIDER_SITE_OTHER): Payer: BC Managed Care – PPO | Admitting: Family Medicine

## 2019-08-18 DIAGNOSIS — F411 Generalized anxiety disorder: Secondary | ICD-10-CM

## 2019-08-18 MED ORDER — BUPROPION HCL ER (XL) 300 MG PO TB24: 300.0000 mg | ORAL_TABLET | Freq: Every morning | ORAL | 11 refills | Status: DC

## 2019-08-18 NOTE — Progress Notes (Signed)
   Subjective:    Patient ID: Thomas Page, male    DOB: 1986/07/31, 33 y.o.   MRN: [id number]  HPI Follow up on anxiety/depression. Takes wellbutrin xl 300mg  daily. Pt states no concerns med is working well. GAD 7 and PHQ9 done.    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  He denies being depressed currently to some degree anxious GAD 7 : Generalized Anxiety Score 08/18/2019 06/24/2018  Nervous, Anxious, on Edge 1 0  Control/stop worrying 1 0  Worry too much - different things 3 0  Trouble relaxing 1 0  Restless 0 0  Easily annoyed or irritable 3 0  Afraid - awful might happen 0 0  Total GAD 7 Score 9 0  Anxiety Difficulty Not difficult at all Not difficult at all    He does state that the medication helps him significantly.  He would like to continue it.  He does not feel he is going to be of harm to himself.  Virtual Visit via Video Note  I connected with Thomas Hua on 08/18/19 at  1:10 PM EDT by a video enabled telemedicine application and verified that I am speaking with the correct person using two identifiers.  Location: Patient: home Provider: office   I discussed the limitations of evaluation and management by telemedicine and the availability of in person appointments. The patient expressed understanding and agreed to proceed.  History of Present Illness:    Observations/Objective:   Assessment and Plan:   Follow Up Instructions:    I discussed the assessment and treatment plan with the patient. The patient was provided an opportunity to ask questions and all were answered. The patient agreed with the plan and demonstrated an understanding of the instructions.   The patient was advised to call back or seek an in-person evaluation if the symptoms worsen or if the condition fails  to improve as anticipated.  I provided 15 minutes of non-face-to-face time during this encounter.       Review of Systems  Constitutional: Negative for diaphoresis and fatigue.  HENT: Negative for congestion and rhinorrhea.   Respiratory: Negative for cough and shortness of breath.   Cardiovascular: Negative for chest pain and leg swelling.  Gastrointestinal: Negative for abdominal pain and diarrhea.  Skin: Negative for color change and rash.  Neurological: Negative for dizziness and headaches.  Psychiatric/Behavioral: Negative for behavioral problems and confusion.       Objective:   Physical Exam   Patient had virtual visit Appears to be in no distress Atraumatic Neuro able to relate and oriented No apparent resp distress Color normal      Assessment & Plan:  Generalized anxiety disorder with some tendency toward depression overall I believe the patient is doing very well currently.  He will continue on the current medication.  He will follow-up in September 2021 I did discuss with him that if anxiety gets worse or his moods get worse to reach out to [place] for further discussion and possible changes to medicine but for now that is not necessary  Patient did cholesterol profile last year look very good no need for blood work currently

## 2020-10-03 ENCOUNTER — Other Ambulatory Visit: Payer: Self-pay | Admitting: Family Medicine

## 2020-10-04 NOTE — Telephone Encounter (Signed)
Last visit 08/18/19. Needs visit and then route back to nurses

## 2020-10-04 NOTE — Telephone Encounter (Signed)
Needs an office visit May have 2 months to cover him until he is seen

## 2020-10-05 NOTE — Telephone Encounter (Signed)
Please schedule visit and then route back to nurses to send in the 2 refills

## 2020-10-10 ENCOUNTER — Telehealth: Payer: Self-pay

## 2020-10-10 DIAGNOSIS — Z13220 Encounter for screening for lipoid disorders: Secondary | ICD-10-CM

## 2020-10-10 DIAGNOSIS — Z79899 Other long term (current) drug therapy: Secondary | ICD-10-CM

## 2020-10-10 MED ORDER — BUPROPION HCL ER (XL) 300 MG PO TB24: 300.0000 mg | ORAL_TABLET | Freq: Every morning | ORAL | 1 refills | Status: DC

## 2020-10-10 NOTE — Telephone Encounter (Signed)
Appt made 12/21 for Med Check does he need blood work ordered?

## 2020-10-10 NOTE — Telephone Encounter (Signed)
Last labs completed TSH LIPID 06/27/18. Please advise. Thank you

## 2020-10-10 NOTE — Telephone Encounter (Signed)
Pt needs refill on buPROPion (WELLBUTRIN XL) 300 MG 24 hr tablet  Sent to [facility] - [place], [place] - [address]   Pt call back [phone]

## 2020-10-10 NOTE — Telephone Encounter (Signed)
Lipid met 7 

## 2020-10-10 NOTE — Telephone Encounter (Signed)
Lab orders placed. Left message to return call  

## 2020-10-10 NOTE — Telephone Encounter (Signed)
Per refill request on 10/03/20; 2 months until seen. 30 days with one refill sent to [facility]. Left message with father to have him call back to schedule appt.

## 2020-10-11 NOTE — Telephone Encounter (Signed)
Patient has appointment on 12/21

## 2020-10-11 NOTE — Telephone Encounter (Signed)
Pt returned call and verbalized understanding  

## 2020-10-25 LAB — BASIC METABOLIC PANEL
BUN/Creatinine Ratio: 13 (ref 9–20)
BUN: 11 mg/dL (ref 6–20)
CO2: 23 mmol/L (ref 20–29)
Calcium: 9.7 mg/dL (ref 8.7–10.2)
Chloride: 101 mmol/L (ref 96–106)
Creatinine, Ser: 0.84 mg/dL (ref 0.76–1.27)
GFR calc Af Amer: 132 mL/min/{1.73_m2} (ref >59)
GFR calc non Af Amer: 114 mL/min/{1.73_m2} (ref >59)
Glucose: 99 mg/dL (ref 65–99)
Potassium: 4.3 mmol/L (ref 3.5–5.2)
Sodium: 139 mmol/L (ref 134–144)

## 2020-10-25 LAB — LIPID PANEL
Chol/HDL Ratio: 3.2 ratio (ref 0.0–5.0)
Cholesterol, Total: 191 mg/dL (ref 100–199)
HDL: 59 mg/dL (ref >39)
LDL Chol Calc (NIH): 112 mg/dL — ABNORMAL HIGH (ref 0–99)
Triglycerides: 114 mg/dL (ref 0–149)
VLDL Cholesterol Cal: 20 mg/dL (ref 5–40)

## 2020-11-01 ENCOUNTER — Ambulatory Visit (INDEPENDENT_AMBULATORY_CARE_PROVIDER_SITE_OTHER): Payer: 59 | Admitting: Family Medicine

## 2020-11-01 ENCOUNTER — Other Ambulatory Visit: Payer: Self-pay

## 2020-11-01 ENCOUNTER — Encounter: Payer: Self-pay | Admitting: Family Medicine

## 2020-11-01 VITALS — BP 126/82 | HR 104 | Temp 98.5°F | Wt 125.8 lb

## 2020-11-01 DIAGNOSIS — F1090 Alcohol use, unspecified, uncomplicated: Secondary | ICD-10-CM

## 2020-11-01 DIAGNOSIS — F418 Other specified anxiety disorders: Secondary | ICD-10-CM | POA: Diagnosis not present

## 2020-11-01 DIAGNOSIS — F411 Generalized anxiety disorder: Secondary | ICD-10-CM

## 2020-11-01 DIAGNOSIS — Z7289 Other problems related to lifestyle: Secondary | ICD-10-CM

## 2020-11-01 DIAGNOSIS — Z789 Other specified health status: Secondary | ICD-10-CM

## 2020-11-01 HISTORY — DX: Other problems related to lifestyle: Z72.89

## 2020-11-01 HISTORY — DX: Other specified health status: Z78.9

## 2020-11-01 HISTORY — DX: Alcohol use, unspecified, uncomplicated: F10.90

## 2020-11-01 NOTE — Progress Notes (Signed)
   Subjective:    Patient ID: Thomas Page, male    DOB: May 04, 1986, 34 y.o.   MRN: [id number]  HPI Patient coming in to follow up on anxiety and depression.  He denies feeling severely depressed denies feeling suicidal.  States he is trying to do the best he can be in healthy eating regular physical activity.  He is cut back on his alcohol use he states a few beers couple times a week but on the weekend he might drink up to a 12 pack I encourage him to try to cut this in half it would be wonderful if he could cut it out altogether but it is unlikely he will do so.  He is a very nice gentleman.  We did discuss how there are medications that can cut cravings of alcohol but he is not interested in those currently  Complains of what he believes is sty on his right eye x 3 days.  Mild stye in the right eye that he is concerned about the area is actually more of an inclusion cyst that is not infected currently hopefully with warm compresses it will get better  Review of Systems  Constitutional: Negative for activity change.  HENT: Negative for congestion and rhinorrhea.   Respiratory: Negative for cough and shortness of breath.   Cardiovascular: Negative for chest pain.  Gastrointestinal: Negative for abdominal pain, diarrhea, nausea and vomiting.  Genitourinary: Negative for dysuria and hematuria.  Neurological: Negative for weakness and headaches.  Psychiatric/Behavioral: Negative for behavioral problems and confusion.       Objective:   Physical Exam Vitals reviewed.  Constitutional:      Appearance: He is well-nourished.  Cardiovascular:     Rate and Rhythm: Normal rate and regular rhythm.     Heart sounds: Normal heart sounds. No murmur heard.   Pulmonary:     Effort: Pulmonary effort is normal.     Breath sounds: Normal breath sounds.  Musculoskeletal:        General: No edema.  Lymphadenopathy:     Cervical: No cervical adenopathy.  Neurological:     Mental Status: He is  alert.  Psychiatric:        Behavior: Behavior normal.           Assessment & Plan:  Inclusion cyst on the conjunctive on no need for antibiotics currently if it becomes tender or swollen notify us If persistent or becomes worse referral to eye specialist  Anxiety and depression doing well on the Wellbutrin feels his symptoms are under good control.  Would like to continue the medicine  Alcohol misuse I have encouraged patient to keep tapering down on alcohol it would be wonderful if he would quit altogether but unfortunately unlikely to do so he has cut it down to 3 days/week and he knows not to drive when he is drinking.  Follow-up in 6 months

## 2020-12-27 ENCOUNTER — Other Ambulatory Visit: Payer: Self-pay | Admitting: Family Medicine

## 2020-12-28 ENCOUNTER — Telehealth: Payer: Self-pay

## 2020-12-28 MED ORDER — BUPROPION HCL ER (XL) 300 MG PO TB24: 300.0000 mg | ORAL_TABLET | Freq: Every morning | ORAL | 5 refills | Status: DC

## 2020-12-28 NOTE — Telephone Encounter (Signed)
May have 6 months on refill ?

## 2020-12-28 NOTE — Telephone Encounter (Signed)
Please advise. (Pharmacy also sent rx request) Thank you

## 2020-12-28 NOTE — Telephone Encounter (Signed)
Pt was written a RX for buPROPion (WELLBUTRIN XL) 300 MG 24 hr tablet for 10/10/20 to 10/10/21 and [facility] - [place], [place] - [address] saying they need refill?   Pt call back [phone]

## 2020-12-28 NOTE — Telephone Encounter (Signed)
Refills sent in and pt is aware °

## 2021-02-15 ENCOUNTER — Encounter: Payer: Self-pay | Admitting: Emergency Medicine

## 2021-02-15 ENCOUNTER — Other Ambulatory Visit: Payer: Self-pay

## 2021-02-15 ENCOUNTER — Ambulatory Visit
Admission: EM | Admit: 2021-02-15 | Discharge: 2021-02-15 | Disposition: A | Discharge status: Home / Self Care | Payer: 59 | Attending: Emergency Medicine | Admitting: Emergency Medicine

## 2021-02-15 DIAGNOSIS — R0981 Nasal congestion: Secondary | ICD-10-CM

## 2021-02-15 DIAGNOSIS — J0140 Acute pansinusitis, unspecified: Secondary | ICD-10-CM

## 2021-02-15 MED ORDER — DOXYCYCLINE HYCLATE 100 MG PO CAPS: 100.0000 mg | ORAL_CAPSULE | Freq: Two times a day (BID) | ORAL | 0 refills | Status: DC

## 2021-02-15 NOTE — ED Provider Notes (Signed)
[facility]   [id number] 02/15/21 Arrival Time: 1040   CC: Sinus infection  SUBJECTIVE: History from: patient.  SHIA EBER is a 35 y.o. male who presents with nasal congestion, runny nose, sinus pain/ pressure, and drainage x 1 month.  Denies sick exposure to COVID, flu or strep.  Has tried OTC medications without relief.  Symptoms are made worse at night.  Reports previous symptoms in the past.   Denies fever, chills, SOB, wheezing, chest pain, nausea, changes in bowel or bladder habits.    ROS: As per HPI.  All other pertinent ROS negative.     Past Medical History:  Diagnosis Date  . Alcohol use 11/01/2020   Patient working hard at trying to curtail the amount of alcohol he drinks  . Depression    Past Surgical History:  Procedure Laterality Date  . TOE ARTHROPLASTY Bilateral 08/21/2017   Procedure: DEROTATIONAL ARTHROPLASTY 4TH TOES LEFT AND RIGHT FOOT;  Surgeon: Ferman Hamming, DPM;  Location: [facility];  Service: Podiatry;  Laterality: Bilateral;   Allergies  Allergen Reactions  . Penicillins Hives  . Amoxil [Amoxicillin] Rash   No current facility-administered medications on file prior to encounter.   Current Outpatient Medications on File Prior to Encounter  Medication Sig Dispense Refill  . buPROPion (WELLBUTRIN XL) 300 MG 24 hr tablet Take 1 tablet (300 mg total) by mouth every morning. 30 tablet 5  . loratadine (CLARITIN) 10 MG tablet Take 10 mg by mouth daily as needed for allergies.     Social History   Socioeconomic History  . Marital status: Single    Spouse name: Not on file  . Number of children: Not on file  . Years of education: Not on file  . Highest education level: Not on file  Occupational History  . Not on file  Tobacco Use  . Smoking status: Never Smoker  . Smokeless tobacco: Never Used  Substance and Sexual Activity  . Alcohol use: Yes    Comment: occasional  . Drug use: No  . Sexual activity: Not on file  Other Topics  Concern  . Not on file  Social History Narrative  . Not on file   Social Determinants of Health   Financial Resource Strain: Not on file  Food Insecurity: Not on file  Transportation Needs: Not on file  Physical Activity: Not on file  Stress: Not on file  Social Connections: Not on file  Intimate Partner Violence: Not on file   No family history on file.  OBJECTIVE:  Vitals:   02/15/21 1051  BP: (!) 150/93  Pulse: 88  Resp: 16  Temp: 98.1 F (36.7 C)  TempSrc: Tympanic  SpO2: 97%     General appearance: alert; appears fatigued, but nontoxic; speaking in full sentences and tolerating own secretions HEENT: NCAT; Ears: EACs clear, TMs pearly gray; Eyes: PERRL.  EOM grossly intact. Sinuses: TTP; Nose: nares patent without rhinorrhea, Throat: oropharynx clear, tonsils non erythematous or enlarged, uvula midline  Neck: supple without LAD Lungs: unlabored respirations, symmetrical air entry; cough: absent; no respiratory distress; CTAB Heart: regular rate and rhythm.   Skin: warm and dry Psychological: alert and cooperative; normal mood and affect  ASSESSMENT & PLAN:  1. Sinus congestion   2. Acute non-recurrent pansinusitis     Meds ordered this encounter  Medications  . doxycycline (VIBRAMYCIN) 100 MG capsule    Sig: Take 1 capsule (100 mg total) by mouth 2 (two) times daily.    Dispense:  20 capsule    Refill:  0    Order Specific Question:   Supervising Provider    Answer:   Eustace Moore [[id number]]   Rest and push fluids Doxycycline prescribed.  Take as directed and to completion Continue with OTC ibuprofen/tylenol as needed for pain Follow up with PCP or [facility] if symptoms persists Return or go to the ED if you have any new or worsening symptoms such as fever, chills, worsening sinus pain/pressure, cough, sore throat, chest pain, shortness of breath, abdominal pain, changes in bowel or bladder habits, etc...   Reviewed expectations re: course of  current medical issues. Questions answered. Outlined signs and symptoms indicating need for more acute intervention. Patient verbalized understanding. After Visit Summary given.         Rennis Harding, PA-C 02/15/21 1129

## 2021-02-15 NOTE — ED Triage Notes (Signed)
Nasal congestion and drainage x 1 month. Sinus pressure started last night, taking Claritin with no relief.

## 2021-02-15 NOTE — Discharge Instructions (Addendum)
Rest and push fluids Doxycycline prescribed.  Take as directed and to completion Continue with OTC ibuprofen/tylenol as needed for pain Follow up with PCP or [facility] if symptoms persists Return or go to the ED if you have any new or worsening symptoms such as fever, chills, worsening sinus pain/pressure, cough, sore throat, chest pain, shortness of breath, abdominal pain, changes in bowel or bladder habits, etc...  

## 2021-03-22 ENCOUNTER — Other Ambulatory Visit: Payer: Self-pay

## 2021-03-22 ENCOUNTER — Ambulatory Visit
Admission: RE | Admit: 2021-03-22 | Discharge: 2021-03-22 | Disposition: A | Discharge status: Home / Self Care | Payer: 59 | Source: Ambulatory Visit

## 2021-03-22 VITALS — BP 147/87 | HR 84 | Temp 98.3°F | Resp 18

## 2021-03-22 DIAGNOSIS — J329 Chronic sinusitis, unspecified: Secondary | ICD-10-CM | POA: Diagnosis not present

## 2021-03-22 DIAGNOSIS — R059 Cough, unspecified: Secondary | ICD-10-CM

## 2021-03-22 MED ORDER — LEVOFLOXACIN 500 MG PO TABS: 500.0000 mg | ORAL_TABLET | Freq: Every day | ORAL | 0 refills | Status: DC

## 2021-03-22 NOTE — Discharge Instructions (Addendum)
I have sent in Levaquin for you to take once daily for 7 days  I have sent in tessalon perles for you to use one capsule every 8 hours as needed for cough.  Follow up with this office or with primary care if symptoms are persisting.  Follow up in the ER for high fever, trouble swallowing, trouble breathing, other concerning symptoms.

## 2021-03-22 NOTE — ED Triage Notes (Signed)
Was treated for a sinus infection a month ago.  States he is still have nasal congestion with yellow discharged.  States ears continue to pop.  States he has been taking allergy medications.

## 2021-03-22 NOTE — ED Provider Notes (Signed)
[facility]    CSN: [id number] Arrival date & time: 03/22/21  1101      History   Chief Complaint No chief complaint on file.   HPI Thomas Page is a 35 y.o. male.   Reports sinus pain, pressure, headache, ear fullness, nasal congestion for the last month. Was treated for sinus infection about a month ago. States that he got better but symptoms never completely resolved. Has been taking OTC allergy medication with little relief. Denies sick contacts. Has negative hx Covid. Has completed Covid vaccines and booster. Has completed flu vaccine. Denies fever, chills, abdominal pain, nausea, vomiting, diarrhea, rash, other symptoms.  ROS per HPI  The history is provided by the patient.    Past Medical History:  Diagnosis Date  . Alcohol use 11/01/2020   Patient working hard at trying to curtail the amount of alcohol he drinks  . Depression     Patient Active Problem List   Diagnosis Date Noted  . Alcohol use 11/01/2020  . Hair loss 06/24/2018  . Generalized anxiety disorder 06/24/2018  . Depression with anxiety 02/21/2016    Past Surgical History:  Procedure Laterality Date  . TOE ARTHROPLASTY Bilateral 08/21/2017   Procedure: DEROTATIONAL ARTHROPLASTY 4TH TOES LEFT AND RIGHT FOOT;  Surgeon: Ferman Hamming, DPM;  Location: [facility];  Service: Podiatry;  Laterality: Bilateral;       Home Medications    Prior to Admission medications   Medication Sig Start Date End Date Taking? Authorizing Provider  cetirizine (ZYRTEC) 10 MG tablet Take 10 mg by mouth daily.   Yes [provider]  levofloxacin (LEVAQUIN) 500 MG tablet Take 1 tablet (500 mg total) by mouth daily. 03/22/21  Yes Moshe Cipro, NP  buPROPion (WELLBUTRIN XL) 300 MG 24 hr tablet Take 1 tablet (300 mg total) by mouth every morning. 12/28/20   Babs Sciara, MD  doxycycline (VIBRAMYCIN) 100 MG capsule Take 1 capsule (100 mg total) by mouth 2 (two) times daily. 02/15/21   Wurst,  [place], PA-C  loratadine (CLARITIN) 10 MG tablet Take 10 mg by mouth daily as needed for allergies.    [provider]    Family History Family History  Problem Relation Age of Onset  . ALS Mother   . Healthy Father     Social History Social History   Tobacco Use  . Smoking status: Never Smoker  . Smokeless tobacco: Never Used  Substance Use Topics  . Alcohol use: Yes    Comment: occasional  . Drug use: No     Allergies   Penicillins and Amoxil [amoxicillin]   Review of Systems Review of Systems   Physical Exam Triage Vital Signs ED Triage Vitals  Enc Vitals Group     BP 03/22/21 1111 (!) 147/87     Pulse Rate 03/22/21 1111 84     Resp 03/22/21 1111 18     Temp 03/22/21 1111 98.3 F (36.8 C)     Temp Source 03/22/21 1111 Oral     SpO2 03/22/21 1111 97 %     Weight --      Height --      Head Circumference --      Peak Flow --      Pain Score 03/22/21 1118 0     Pain Loc --      Pain Edu? --      Excl. in GC? --    No data found.  Updated Vital Signs BP (!) 147/87  Pulse 84   Temp 98.3 F (36.8 C) (Oral)   Resp 18   SpO2 97%      Physical Exam Vitals and nursing note reviewed.  Constitutional:      General: He is not in acute distress.    Appearance: Normal appearance. He is well-developed and normal weight. He is not ill-appearing.  HENT:     Head: Normocephalic and atraumatic.     Right Ear: Tympanic membrane, ear canal and external ear normal.     Left Ear: Tympanic membrane, ear canal and external ear normal.     Nose: Congestion present.     Right Sinus: Maxillary sinus tenderness and frontal sinus tenderness present.     Left Sinus: Maxillary sinus tenderness and frontal sinus tenderness present.     Mouth/Throat:     Mouth: Mucous membranes are moist.     Pharynx: Posterior oropharyngeal erythema present.  Eyes:     Extraocular Movements: Extraocular movements intact.     Conjunctiva/sclera: Conjunctivae normal.      Pupils: Pupils are equal, round, and reactive to light.  Cardiovascular:     Rate and Rhythm: Normal rate and regular rhythm.     Heart sounds: Normal heart sounds. No murmur heard.   Pulmonary:     Effort: Pulmonary effort is normal. No respiratory distress.     Breath sounds: Normal breath sounds. No stridor. No wheezing, rhonchi or rales.  Chest:     Chest wall: No tenderness.  Abdominal:     General: Bowel sounds are normal.     Palpations: Abdomen is soft.     Tenderness: There is no abdominal tenderness.  Musculoskeletal:        General: Normal range of motion.     Cervical back: Normal range of motion and neck supple.  Skin:    General: Skin is warm and dry.     Capillary Refill: Capillary refill takes less than 2 seconds.  Neurological:     General: No focal deficit present.     Mental Status: He is alert and oriented to person, place, and time.  Psychiatric:        Mood and Affect: Mood normal.        Behavior: Behavior normal.        Thought Content: Thought content normal.      UC Treatments / Results  Labs (all labs ordered are listed, but only abnormal results are displayed) Labs Reviewed - No data to display  EKG   Radiology No results found.  Procedures Procedures (including critical care time)  Medications Ordered in UC Medications - No data to display  Initial Impression / Assessment and Plan / UC Course  I have reviewed the triage vital signs and the nursing notes.  Pertinent labs & imaging results that were available during my care of the patient were reviewed by me and considered in my medical decision making (see chart for details).    Recurrent Sinusitis Cough  Prescribed Levaquin 500mg  once daily for 7 days. Prescribed tessalon perles Follow up with this office or with primary care if symptoms are persisting. Follow up in the ER for high fever, trouble swallowing, trouble breathing, other concerning symptoms.   Final Clinical  Impressions(s) / UC Diagnoses   Final diagnoses:  Recurrent sinusitis  Cough     Discharge Instructions     I have sent in Levaquin for you to take once daily for 7 days  I have sent in tessalon perles  for you to use one capsule every 8 hours as needed for cough.  Follow up with this office or with primary care if symptoms are persisting.  Follow up in the ER for high fever, trouble swallowing, trouble breathing, other concerning symptoms.       ED Prescriptions    Medication Sig Dispense Auth. Provider   levofloxacin (LEVAQUIN) 500 MG tablet Take 1 tablet (500 mg total) by mouth daily. 7 tablet Moshe Cipro, NP     PDMP not reviewed this encounter.   Moshe Cipro, NP 03/25/21 [phone]

## 2021-04-08 ENCOUNTER — Ambulatory Visit
Admission: RE | Admit: 2021-04-08 | Discharge: 2021-04-08 | Disposition: A | Discharge status: Home / Self Care | Payer: 59 | Source: Ambulatory Visit | Attending: Family Medicine | Admitting: Family Medicine

## 2021-04-08 ENCOUNTER — Other Ambulatory Visit: Payer: Self-pay

## 2021-04-08 VITALS — BP 135/82 | HR 84 | Temp 98.2°F | Resp 16

## 2021-04-08 DIAGNOSIS — J329 Chronic sinusitis, unspecified: Secondary | ICD-10-CM | POA: Diagnosis not present

## 2021-04-08 MED ORDER — LEVOCETIRIZINE DIHYDROCHLORIDE 5 MG PO TABS: 5.0000 mg | ORAL_TABLET | Freq: Every evening | ORAL | 0 refills | Status: DC

## 2021-04-08 MED ORDER — FLUTICASONE PROPIONATE 50 MCG/ACT NA SUSP: 2.0000 | Freq: Every day | NASAL | 2 refills | Status: DC

## 2021-04-08 MED ORDER — LEVOFLOXACIN 500 MG PO TABS: 500.0000 mg | ORAL_TABLET | Freq: Every day | ORAL | 0 refills | Status: DC

## 2021-04-08 NOTE — ED Provider Notes (Signed)
[facility]    CSN: [id number] Arrival date & time: 04/08/21  1350      History   Chief Complaint No chief complaint on file.   HPI Thomas Page is a 35 y.o. male.   Reports recurrent sinusitis.  States that this is the third time he has been seen for this since April in this office.  Reports nasal congestion, sore throat, sinus pain and pressure as well as dark yellow sinus drainage.  Reports that his last infection did seem to clear.  Reports that it did not stay gone for very long.  Reports that this has been back for about 5 days.  Has been taking Xyzal at home.  Denies abdominal pain, nausea, vomiting, diarrhea, rash, fever, other symptoms.  ROS per HPI  The history is provided by the patient.    Past Medical History:  Diagnosis Date  . Alcohol use 11/01/2020   Patient working hard at trying to curtail the amount of alcohol he drinks  . Depression     Patient Active Problem List   Diagnosis Date Noted  . Alcohol use 11/01/2020  . Hair loss 06/24/2018  . Generalized anxiety disorder 06/24/2018  . Depression with anxiety 02/21/2016    Past Surgical History:  Procedure Laterality Date  . TOE ARTHROPLASTY Bilateral 08/21/2017   Procedure: DEROTATIONAL ARTHROPLASTY 4TH TOES LEFT AND RIGHT FOOT;  Surgeon: Ferman Hamming, DPM;  Location: [facility];  Service: Podiatry;  Laterality: Bilateral;       Home Medications    Prior to Admission medications   Medication Sig Start Date End Date Taking? Authorizing Provider  fluticasone (FLONASE) 50 MCG/ACT nasal spray Place 2 sprays into both nostrils daily. 04/08/21  Yes Moshe Cipro, NP  levocetirizine (XYZAL) 5 MG tablet Take 1 tablet (5 mg total) by mouth every evening. 04/08/21 05/08/21 Yes Moshe Cipro, NP  levofloxacin (LEVAQUIN) 500 MG tablet Take 1 tablet (500 mg total) by mouth daily. 04/08/21  Yes Moshe Cipro, NP  buPROPion (WELLBUTRIN XL) 300 MG 24 hr tablet Take 1 tablet (300 mg  total) by mouth every morning. 12/28/20   Babs Sciara, MD  doxycycline (VIBRAMYCIN) 100 MG capsule Take 1 capsule (100 mg total) by mouth 2 (two) times daily. 02/15/21   Wurst, [place], PA-C  loratadine (CLARITIN) 10 MG tablet Take 10 mg by mouth daily as needed for allergies.    [provider]    Family History Family History  Problem Relation Age of Onset  . ALS Mother   . Healthy Father     Social History Social History   Tobacco Use  . Smoking status: Never Smoker  . Smokeless tobacco: Never Used  Substance Use Topics  . Alcohol use: Yes    Comment: occasional  . Drug use: No     Allergies   Penicillins and Amoxil [amoxicillin]   Review of Systems Review of Systems   Physical Exam Triage Vital Signs ED Triage Vitals  Enc Vitals Group     BP 04/08/21 1356 135/82     Pulse Rate 04/08/21 1356 84     Resp 04/08/21 1356 16     Temp 04/08/21 1356 98.2 F (36.8 C)     Temp src --      SpO2 04/08/21 1356 95 %     Weight --      Height --      Head Circumference --      Peak Flow --  Pain Score 04/08/21 1358 0     Pain Loc --      Pain Edu? --      Excl. in GC? --    No data found.  Updated Vital Signs BP 135/82 (BP Location: Right Arm)   Pulse 84   Temp 98.2 F (36.8 C)   Resp 16   SpO2 95%        Physical Exam Vitals and nursing note reviewed.  Constitutional:      General: He is not in acute distress.    Appearance: Normal appearance. He is well-developed and normal weight.  HENT:     Head: Normocephalic and atraumatic.     Right Ear: Tympanic membrane, ear canal and external ear normal.     Left Ear: Tympanic membrane, ear canal and external ear normal.     Nose: Congestion present.     Comments: Frontal and maxillary sinus tenderness    Mouth/Throat:     Mouth: Mucous membranes are moist.     Pharynx: Posterior oropharyngeal erythema present.     Comments: Cobblestoning present Eyes:     Extraocular Movements: Extraocular  movements intact.     Conjunctiva/sclera: Conjunctivae normal.     Pupils: Pupils are equal, round, and reactive to light.  Cardiovascular:     Rate and Rhythm: Normal rate and regular rhythm.     Heart sounds: Normal heart sounds.  Pulmonary:     Effort: Pulmonary effort is normal. No respiratory distress.     Breath sounds: Normal breath sounds. No stridor. No wheezing, rhonchi or rales.  Chest:     Chest wall: No tenderness.  Musculoskeletal:        General: Normal range of motion.     Cervical back: Normal range of motion and neck supple.  Lymphadenopathy:     Cervical: Cervical adenopathy present.  Skin:    General: Skin is warm and dry.     Capillary Refill: Capillary refill takes less than 2 seconds.  Neurological:     General: No focal deficit present.     Mental Status: He is alert and oriented to person, place, and time.  Psychiatric:        Mood and Affect: Mood normal.        Behavior: Behavior normal.        Thought Content: Thought content normal.      UC Treatments / Results  Labs (all labs ordered are listed, but only abnormal results are displayed) Labs Reviewed - No data to display  EKG   Radiology No results found.  Procedures Procedures (including critical care time)  Medications Ordered in UC Medications - No data to display  Initial Impression / Assessment and Plan / UC Course  I have reviewed the triage vital signs and the nursing notes.  Pertinent labs & imaging results that were available during my care of the patient were reviewed by me and considered in my medical decision making (see chart for details).    Recurrent sinusitis  Levaquin prescribed once daily x7 days Flonase prescribed Xyzal prescribed May take Allegra besides all May need to visit ENT if symptoms persist Follow up with this office or with primary care if symptoms are persisting.  Follow up in the ER for high fever, trouble swallowing, trouble breathing, other  concerning symptoms.   Final Clinical Impressions(s) / UC Diagnoses   Final diagnoses:  Recurrent sinusitis     Discharge Instructions     I have sent  in levaquin to take once a day for 7 days  I have sent in flonase for you to use 2 sprays per nostril daily as needed for nasal congestion.  I have sent in xyzal for you to take daily. You may take allegra with this medication  Follow up with this office or with primary care if symptoms are persisting.  Follow up in the ER for high fever, trouble swallowing, trouble breathing, other concerning symptoms.     ED Prescriptions    Medication Sig Dispense Auth. Provider   levocetirizine (XYZAL) 5 MG tablet Take 1 tablet (5 mg total) by mouth every evening. 30 tablet Moshe Cipro, NP   levofloxacin (LEVAQUIN) 500 MG tablet Take 1 tablet (500 mg total) by mouth daily. 7 tablet Moshe Cipro, NP   fluticasone (FLONASE) 50 MCG/ACT nasal spray Place 2 sprays into both nostrils daily. 9.9 mL Moshe Cipro, NP     PDMP not reviewed this encounter.   Moshe Cipro, NP 04/08/21 1452

## 2021-04-08 NOTE — ED Triage Notes (Signed)
Productive cough - yellow mucus.  Sore throat, nasal congestion.  3rd time being seen for this problem.

## 2021-04-08 NOTE — Discharge Instructions (Addendum)
I have sent in levaquin to take once a day for 7 days  I have sent in flonase for you to use 2 sprays per nostril daily as needed for nasal congestion.  I have sent in xyzal for you to take daily. You may take allegra with this medication  Follow up with this office or with primary care if symptoms are persisting.  Follow up in the ER for high fever, trouble swallowing, trouble breathing, other concerning symptoms.

## 2021-04-11 ENCOUNTER — Telehealth: Payer: Self-pay | Admitting: Family Medicine

## 2021-04-11 DIAGNOSIS — J329 Chronic sinusitis, unspecified: Secondary | ICD-10-CM

## 2021-04-11 NOTE — Telephone Encounter (Signed)
Patient has been seen at urgent care 3 times with sinus infection and they suggested that he get a referral to ENT. He would like [place] office please . His number-[phone]

## 2021-04-11 NOTE — Telephone Encounter (Signed)
I am fine with going ahead with ENT referral Unfortunately no ENT office in [facility] in [place] would be next step

## 2021-04-11 NOTE — Telephone Encounter (Signed)
Referral ordered in Epic. Left message to return call 

## 2021-04-12 NOTE — Telephone Encounter (Signed)
Patient notified

## 2021-04-24 ENCOUNTER — Other Ambulatory Visit (HOSPITAL_COMMUNITY): Payer: Self-pay | Admitting: Otolaryngology

## 2021-04-24 DIAGNOSIS — J329 Chronic sinusitis, unspecified: Secondary | ICD-10-CM

## 2021-05-02 ENCOUNTER — Other Ambulatory Visit: Payer: Self-pay

## 2021-05-02 ENCOUNTER — Encounter: Payer: Self-pay | Admitting: Family Medicine

## 2021-05-02 ENCOUNTER — Ambulatory Visit (INDEPENDENT_AMBULATORY_CARE_PROVIDER_SITE_OTHER): Payer: 59 | Admitting: Family Medicine

## 2021-05-02 VITALS — BP 126/84 | HR 85 | Temp 98.6°F | Ht 68.0 in | Wt 125.0 lb

## 2021-05-02 DIAGNOSIS — J329 Chronic sinusitis, unspecified: Secondary | ICD-10-CM | POA: Diagnosis not present

## 2021-05-02 DIAGNOSIS — Z0001 Encounter for general adult medical examination with abnormal findings: Secondary | ICD-10-CM

## 2021-05-02 DIAGNOSIS — F411 Generalized anxiety disorder: Secondary | ICD-10-CM

## 2021-05-02 DIAGNOSIS — Z0000 Encounter for general adult medical examination without abnormal findings: Secondary | ICD-10-CM

## 2021-05-02 MED ORDER — LEVOCETIRIZINE DIHYDROCHLORIDE 5 MG PO TABS: 5.0000 mg | ORAL_TABLET | Freq: Every evening | ORAL | 11 refills | Status: DC

## 2021-05-02 MED ORDER — BUPROPION HCL ER (XL) 300 MG PO TB24: 300.0000 mg | ORAL_TABLET | Freq: Every morning | ORAL | 5 refills | Status: DC

## 2021-05-02 NOTE — Progress Notes (Signed)
   Subjective:    Patient ID: Thomas Page, male    DOB: 08-Apr-1986, 35 y.o.   MRN: [id number]  HPIfollow up on depression and anxiety. Taking wellbutrin and states it is working well for him.  Patient states things seem to be under good control.  He states he is trying to avoid alcohol as much as possible  Would like a refill on xyzal.  Has a fair amount of allergies recently had problems with sinus infections   Would like to know if there is a way to test for med allergies to see if he is truly allergic to pen and amoxil.     Review of Systems     Objective:   Physical Exam General-in no acute distress Eyes-no discharge Lungs-respiratory rate normal, CTA CV-no murmurs,RRR Extremities skin warm dry no edema Neuro grossly normal Behavior normal, alert    Patient not suicidal    Assessment & Plan:  Adult wellness-complete.wellness physical was conducted today. Importance of diet and exercise were discussed in detail.  In addition to this a discussion regarding safety was also covered. We also reviewed over immunizations and gave recommendations regarding current immunization needed for age.  In addition to this additional areas were also touched on including: Preventative health exams needed:  Colonoscopy not indicated  Patient was advised yearly wellness exam  Mild generalized anxiety doing well on the Wellbutrin continue current measures.  Minimize alcohol use.  Follow-up in 6 months.  Patient with chronic sinusitis but now doing well was scheduled to have a CT scan of the sinuses but since he is doing well he is thinking about not doing that  I did advise the patient if he is interested in getting testing for amoxicillin allergy we can do so through allergist patient will think about this

## 2021-05-03 ENCOUNTER — Other Ambulatory Visit: Payer: Self-pay

## 2021-05-03 DIAGNOSIS — Z0182 Encounter for allergy testing: Secondary | ICD-10-CM

## 2021-05-08 ENCOUNTER — Other Ambulatory Visit: Payer: Self-pay

## 2021-05-08 ENCOUNTER — Ambulatory Visit (HOSPITAL_COMMUNITY)
Admission: RE | Admit: 2021-05-08 | Discharge: 2021-05-08 | Disposition: A | Discharge status: Home / Self Care | Payer: 59 | Source: Ambulatory Visit | Attending: Otolaryngology | Admitting: Otolaryngology

## 2021-05-08 DIAGNOSIS — J329 Chronic sinusitis, unspecified: Secondary | ICD-10-CM | POA: Diagnosis not present

## 2021-06-02 ENCOUNTER — Other Ambulatory Visit: Payer: Self-pay | Admitting: Otolaryngology

## 2021-06-22 ENCOUNTER — Other Ambulatory Visit: Payer: Self-pay

## 2021-06-22 ENCOUNTER — Encounter (HOSPITAL_BASED_OUTPATIENT_CLINIC_OR_DEPARTMENT_OTHER): Payer: Self-pay | Admitting: Otolaryngology

## 2021-06-28 ENCOUNTER — Encounter: Payer: Self-pay | Admitting: Allergy & Immunology

## 2021-06-28 ENCOUNTER — Ambulatory Visit (INDEPENDENT_AMBULATORY_CARE_PROVIDER_SITE_OTHER): Payer: 59 | Admitting: Allergy & Immunology

## 2021-06-28 ENCOUNTER — Other Ambulatory Visit: Payer: Self-pay

## 2021-06-28 VITALS — BP 120/78 | HR 72 | Temp 97.8°F | Resp 18 | Ht 60.0 in | Wt 127.4 lb

## 2021-06-28 DIAGNOSIS — Z889 Allergy status to unspecified drugs, medicaments and biological substances status: Secondary | ICD-10-CM | POA: Diagnosis not present

## 2021-06-28 DIAGNOSIS — J3089 Other allergic rhinitis: Secondary | ICD-10-CM | POA: Diagnosis not present

## 2021-06-28 DIAGNOSIS — J302 Other seasonal allergic rhinitis: Secondary | ICD-10-CM

## 2021-06-28 DIAGNOSIS — J310 Chronic rhinitis: Secondary | ICD-10-CM

## 2021-06-28 NOTE — Progress Notes (Signed)
NEW PATIENT  Date of Service/Encounter:  06/28/21  Consult requested by: Babs SciaraLuking, Scott A, MD   Assessment:   Chronic rhinitis (indoor molds, outdoor molds, and dust mites)  Drug allergy - low risk and needs challenge  Plan/Recommendations:   1. Chronic rhinitis - Testing today showed: indoor molds, outdoor molds, and dust mites - Copy of test results provided.  - Avoidance measures provided. - Continue with: Xyzal (levocetirizine) 5mg  tablet once daily and Flonase (fluticasone) two sprays per nostril daily - You can use an extra dose of the antihistamine, if needed, for breakthrough symptoms.  - Consider nasal saline rinses 1-2 times daily to remove allergens from the nasal cavities as well as help with mucous clearance (this is especially helpful to do before the nasal sprays are given) - We can regroup once you have your surgery to see if that helped.   2. Drug allergy - Your penicillin history is low risk. - I do not think that testing is even necessary. - I would like to get you in for a penicillin drug challenge. - We could watch you take penicillin and monitor how you do.  3. Return in about 4 weeks for a penicillin challenge.    This note in its entirety was forwarded to the Provider who requested this consultation.  Subjective:   Thomas Page is a 35 y.o. male presenting today for evaluation of  Chief Complaint  Patient presents with   Allergy Testing    Penicillin     Thomas Page has a history of the following: Patient Active Problem List   Diagnosis Date Noted   Alcohol use 11/01/2020   Hair loss 06/24/2018   Generalized anxiety disorder 06/24/2018   Depression with anxiety 02/21/2016    History obtained from: chart review and patient.  Thomas Capricehomas F Watterson was referred by Babs SciaraLuking, Scott A, MD.     Thomas Page is a 35 y.o. male presenting for an evaluation of environmental allergies .   He is wondering about his reaction to penicillin. He is hoping  that he can take this off of his record. His original reaction was told by his mother. He reports that he had a rash or hives when he was very young. He did not go to the ED and does not remember having to be treated for it. He thinks that he did have a reaction to the penicillin. His mother passed away from ALS in 2013.   He does not routinely need antibiotics. He is having sinus surgery on Monday. He has had a sinus infection for 6 months. He had three rounds of antibiotics and it did not help. He started having issues in February 2022. Prior to that, he did not have any issues with sinus infections. This is something that is odd for him. He estimates that he got antibiotics rarely if ever prior to 2022.   He had a sinus CT in June 2022:  1. 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 2. Mild leftward septal deviation and leftward projecting spur.  He is scheduled for surgery next week with Dr. Suszanne Connerseoh. He has the entire week off for for recovery. He is hoping that his helps with his chronic congestion.    Allergic Rhinitis Symptom History: He has some sneezing in the spring. He is on Xyzal daily until this appointment. He did have Flonase at one point. He does not use it routinely.   Otherwise, there is  no history of other atopic diseases, including drug allergies, stinging insect allergies, eczema, urticaria, or contact dermatitis. There is no significant infectious history. Vaccinations are up to date.    Past Medical History: Patient Active Problem List   Diagnosis Date Noted   Alcohol use 11/01/2020   Hair loss 06/24/2018   Generalized anxiety disorder 06/24/2018   Depression with anxiety 02/21/2016    Medication List:  Allergies as of 06/28/2021       Reactions   Penicillins Hives   Amoxil [amoxicillin] Rash        Medication List        Accurate as of June 28, 2021  1:08 PM. If you have any questions, ask your nurse  or doctor.          STOP taking these medications    fluticasone 50 MCG/ACT nasal spray Commonly known as: FLONASE Stopped by: Alfonse Spruce, MD       TAKE these medications    buPROPion 300 MG 24 hr tablet Commonly known as: WELLBUTRIN XL Take 1 tablet (300 mg total) by mouth every morning.   levocetirizine 5 MG tablet Commonly known as: XYZAL Take 1 tablet (5 mg total) by mouth every evening.        Birth History: non-contributory  Developmental History: non-contributory  Past Surgical History: Past Surgical History:  Procedure Laterality Date   TOE ARTHROPLASTY Bilateral 08/21/2017   Procedure: DEROTATIONAL ARTHROPLASTY 4TH TOES LEFT AND RIGHT FOOT;  Surgeon: Ferman Hamming, DPM;  Location: [facility];  Service: Podiatry;  Laterality: Bilateral;     Family History: Family History  Problem Relation Age of Onset   Allergic rhinitis Mother    Asthma Mother    ALS Mother    Healthy Father    Allergic rhinitis Brother      Social History: Thomas Page lives at home with his father and older brother.  They live in a house that was built in 1974.  There is wood throughout the home.  He has electric heating and central cooling.  There is 1 cat inside (x 10 years) of the home and 2 cats outside of the home.  There are no dust mite covers on the bedding.  There is no tobacco exposure.  He currently works in [employer] for the past 21 years (started working at [employer] when he was 35 years old).  He is not exposed to fumes, chemicals, or dust.  He does not use a HEPA filter in the home.   Review of Systems  Constitutional: Negative.  Negative for chills, fever, malaise/fatigue and weight loss.  HENT:  Positive for congestion. Negative for ear discharge and ear pain.        Positive for difficulty breathing.   Eyes:  Negative for pain, discharge and redness.  Respiratory:  Negative for cough, sputum production, shortness of breath and wheezing.   Cardiovascular:  Negative.  Negative for chest pain and palpitations.  Gastrointestinal:  Negative for abdominal pain, constipation, diarrhea, heartburn, nausea and vomiting.  Skin: Negative.  Negative for itching and rash.  Neurological:  Negative for dizziness and headaches.  Endo/Heme/Allergies:  Negative for environmental allergies. Does not bruise/bleed easily.      Objective:   Blood pressure 120/78, pulse 72, temperature 97.8 F (36.6 C), temperature source Temporal, resp. rate 18, height 5' (1.524 m), weight 127 lb 6.4 oz (57.8 kg), SpO2 97 %. Body mass index is 24.88 kg/m.   Physical Exam:   Physical Exam Vitals reviewed.  Constitutional:      Appearance: He is well-developed.  HENT:     Head: Normocephalic and atraumatic.     Right Ear: Tympanic membrane, ear canal and external ear normal.     Left Ear: Tympanic membrane, ear canal and external ear normal.     Nose: Mucosal edema and rhinorrhea present. No nasal deformity or septal deviation.     Right Turbinates: Enlarged, swollen and pale.     Left Turbinates: Enlarged, swollen and pale.     Right Sinus: No maxillary sinus tenderness or frontal sinus tenderness.     Left Sinus: No maxillary sinus tenderness or frontal sinus tenderness.     Comments: No polyps. Purulent discharge.     Mouth/Throat:     Lips: Pink.     Mouth: Mucous membranes are not pale and not dry.     Pharynx: Uvula midline. No pharyngeal swelling, oropharyngeal exudate, posterior oropharyngeal erythema or uvula swelling.  Eyes:     General: Lids are normal. No allergic shiner.       Right eye: No discharge.        Left eye: No discharge.     Conjunctiva/sclera: Conjunctivae normal.     Right eye: Right conjunctiva is not injected. No chemosis.    Left eye: Left conjunctiva is not injected. No chemosis.    Pupils: Pupils are equal, round, and reactive to light.  Cardiovascular:     Rate and Rhythm: Normal rate and regular rhythm.     Heart sounds: Normal  heart sounds.  Pulmonary:     Effort: Pulmonary effort is normal. No tachypnea, accessory muscle usage or respiratory distress.     Breath sounds: Normal breath sounds. No wheezing, rhonchi or rales.     Comments: Moving air well in all lung fields.  Chest:     Chest wall: No tenderness.  Lymphadenopathy:     Cervical: No cervical adenopathy.  Skin:    General: Skin is warm.     Capillary Refill: Capillary refill takes less than 2 seconds.     Coloration: Skin is not pale.     Findings: No abrasion, erythema, petechiae or rash. Rash is not papular, urticarial or vesicular.     Comments: No eczematous or urticarial.   Neurological:     Mental Status: He is alert.  Psychiatric:        Behavior: Behavior is cooperative.     Diagnostic studies:   Allergy Studies:     Airborne Adult Perc - 06/28/21 0847     Time Antigen Placed 0847    Allergen Manufacturer Waynette Buttery    Location Back    Number of Test 59    Panel 1 Select    1. Control-Buffer 50% Glycerol Negative    2. Control-Histamine 1 mg/ml 2+    3. Albumin saline Negative    4. Bahia Negative    5. [place] Territ[place] Negative    6. Johnson Negative    7. Kentucky Blue Negative    8. Meadow Fescue Negative    9. Perennial Rye Negative    10. Sweet Vernal Negative    11. Timothy Negative    12. Cocklebur Negative    13. Burweed Marshelder Negative    14. Ragweed, short Negative    15. Ragweed, Giant Negative    16. Plantain,  English Negative    17. Lamb's Quarters Negative    18. Sheep Sorrell Negative    19. Rough Pigweed Negative    20.  Marsh Elder, Rough Negative    21. Mugwort, Common Negative    22. Ash mix Negative    23. Birch mix Negative    24. Beech American Negative    25. Box, Elder Negative    26. Cedar, red Negative    27. Cottonwood, Guinea-Bissau Negative    28. Elm mix Negative    29. Hickory Negative    30. Maple mix Negative    31. Oak, Guinea-Bissau mix Negative    32. Pecan Pollen Negative    33. Pine mix  Negative    34. Sycamore Eastern Negative    35. Walnut, Black Pollen Negative    36. Alternaria alternata Negative    37. Cladosporium Herbarum Negative    38. Aspergillus mix Negative    39. Penicillium mix Negative    40. Bipolaris sorokiniana (Helminthosporium) Negative    41. Drechslera spicifera (Curvularia) Negative    42. Mucor plumbeus Negative    43. Fusarium moniliforme Negative    44. Aureobasidium pullulans (pullulara) Negative    45. Rhizopus oryzae Negative    46. Botrytis cinera Negative    47. Epicoccum nigrum Negative    48. Phoma betae Negative    49. Candida Albicans Negative    50. Trichophyton mentagrophytes 3+    51. Mite, D Farinae  5,000 AU/ml Negative    52. Mite, D Pteronyssinus  5,000 AU/ml 2+    53. Cat Hair 10,000 BAU/ml Negative    54.  Dog Epithelia Negative    55. Mixed Feathers Negative    56. Horse Epithelia Negative    57. Cockroach, German Negative    58. Mouse Negative    59. Tobacco Leaf Negative             Intradermal - 06/28/21 0917     Time Antigen Placed 6256    Allergen Manufacturer Waynette Buttery    Location Back    Number of Test 14    Intradermal Select    Control Negative    [place] Southern Territories Negative    Johnson Negative    7 Grass Negative    Ragweed mix Negative    Weed mix Negative    Tree mix Negative    Mold 1 1+    Mold 2 2+    Mold 3 Negative    Mold 4 Negative    Cat Negative    Dog Negative    Cockroach Negative             Allergy testing results were read and interpreted by myself, documented by clinical staff.         Malachi Bonds, MD [facility]

## 2021-06-28 NOTE — Patient Instructions (Addendum)
1. Chronic rhinitis - Testing today showed: indoor molds, outdoor molds, and dust mites - Copy of test results provided.  - Avoidance measures provided. - Continue with: Xyzal (levocetirizine) 5mg  tablet once daily and Flonase (fluticasone) two sprays per nostril daily - You can use an extra dose of the antihistamine, if needed, for breakthrough symptoms.  - Consider nasal saline rinses 1-2 times daily to remove allergens from the nasal cavities as well as help with mucous clearance (this is especially helpful to do before the nasal sprays are given) - We can regroup once you have your surgery to see if that helped.   2. Drug allergy - Your penicillin history is low risk. - I do not think that testing is even necessary. - I would like to get you in for a penicillin drug challenge. - We could watch you take penicillin and monitor how you do.  3. Return in about 4 weeks for a penicillin challenge.    Please inform of any Emergency Department visits, hospitalizations, or changes in symptoms. Call us before going to the ED for breathing or allergy symptoms since we might be able to fit you in for a sick visit. Feel free to contact us anytime with any questions, problems, or concerns.  It was a pleasure to meet you today!  Websites that have reliable patient information: 1. American Academy of Asthma, Allergy, and Immunology: www.aaaai.org 2. Food Allergy Research and Education (FARE): foodallergy.org 3. Mothers of Asthmatics: http://www.asthmacommunitynetwork.org 4. American College of Allergy, Asthma, and Immunology: www.acaai.org   COVID-19 Vaccine Information can be found at: [place] For questions related to vaccine distribution or appointments, please email [email or web address] or call [phone].   We realize that you might be concerned about having an allergic reaction to the COVID19 vaccines. To help with  that concern, WE ARE OFFERING THE COVID19 VACCINES IN OUR OFFICE! Ask the front desk for dates!     "Like" [phone] on Facebook and Instagram for our latest updates!      A healthy democracy works best when [place] participate! Make sure you are registered to vote! If you have moved or changed any of your contact information, you will need to get this updated before voting!  In some cases, you MAY be able to register to vote online: [employer]     Control of Dust Mite Allergen    Dust mites play a major role in allergic asthma and rhinitis.  They occur in environments with high humidity wherever human skin is found.  Dust mites absorb humidity from the atmosphere (ie, they do not drink) and feed on organic matter (including shed human and animal skin).  Dust mites are a microscopic type of insect that you cannot see with the naked eye.  High levels of dust mites have been detected from mattresses, pillows, carpets, upholstered furniture, bed covers, clothes, soft toys and any woven material.  The principal allergen of the dust mite is found in its feces.  A gram of dust may contain 1,000 mites and 250,000 fecal particles.  Mite antigen is easily measured in the air during house cleaning activities.  Dust mites do not bite and do not cause harm to humans, other than by triggering allergies/asthma.    Ways to decrease your exposure to dust mites in your home:  Encase mattresses, box springs and pillows with a mite-impermeable barrier or cover   Wash sheets, blankets and drapes weekly in hot water (130 F) with detergent and dry them in  a dryer on the hot setting.  Have the room cleaned frequently with a vacuum cleaner and a damp dust-mop.  For carpeting or rugs, vacuuming with a vacuum cleaner equipped with a high-efficiency particulate air (HEPA) filter.  The dust mite allergic individual should not be in a room which is being cleaned and should wait 1 hour  after cleaning before going into the room. Do not sleep on upholstered furniture (eg, couches).   If possible removing carpeting, upholstered furniture and drapery from the home is ideal.  Horizontal blinds should be eliminated in the rooms where the person spends the most time (bedroom, study, television room).  Washable vinyl, roller-type shades are optimal. Remove all non-washable stuffed toys from the bedroom.  Wash stuffed toys weekly like sheets and blankets above.   Reduce indoor humidity to less than 50%.  Inexpensive humidity monitors can be purchased at most hardware stores.  Do not use a humidifier as can make the problem worse and are not recommended.  Control of Mold Allergen   Mold and fungi can grow on a variety of surfaces provided certain temperature and moisture conditions exist.  Outdoor molds grow on plants, decaying vegetation and soil.  The major outdoor mold, Alternaria and Cladosporium, are found in very high numbers during hot and dry conditions.  Generally, a late Summer - Fall peak is seen for common outdoor fungal spores.  Rain will temporarily lower outdoor mold spore count, but counts rise rapidly when the rainy period ends.  The most important indoor molds are Aspergillus and Penicillium.  Dark, humid and poorly ventilated basements are ideal sites for mold growth.  The next most common sites of mold growth are the bathroom and the kitchen.  Outdoor (Seasonal) Mold Control  Positive outdoor molds via skin testing: Alternaria and Cladosporium  Use air conditioning and keep windows closed Avoid exposure to decaying vegetation. Avoid leaf raking. Avoid grain handling. Consider wearing a face mask if working in moldy areas.    Indoor (Perennial) Mold Control   Positive indoor molds via skin testing: Aspergillus, Penicillium, and Trichophyton  Maintain humidity below 50%. Clean washable surfaces with 5% bleach solution. Remove sources e.g. contaminated  carpets.   1. Control-Buffer 50% Glycerol Negative   2. Control-Histamine 1 mg/ml 2+   3. Albumin saline Negative   4. Bahia Negative   5. French [place] Territ[place] Negative   6. Johnson Negative   7. Kentucky Blue Negative   8. Meadow Fescue Negative   9. Perennial Rye Negative   10. Sweet Vernal Negative   11. Timothy Negative   12. Cocklebur Negative   13. Burweed Marshelder Negative   14. Ragweed, short Negative   15. Ragweed, Giant Negative   16. Plantain,  English Negative   17. Lamb's Quarters Negative   18. Sheep Sorrell Negative   19. Rough Pigweed Negative   20. Marsh Elder, Rough Negative   21. Mugwort, Common Negative   22. Ash mix Negative   23. Birch mix Negative   24. Beech American Negative   25. Box, Elder Negative   26. Cedar, red Negative   27. Cottonwood, Guinea-Bissau Negative   28. Elm mix Negative   29. Hickory Negative   30. Maple mix Negative   31. Oak, [place] mix Negative   32. Pecan Pollen Negative   33. Pine mix Negative   34. Sycamore Eastern Negative   35. Walnut, Black Pollen Negative   36. Alternaria alternata Negative   37. Cladosporium Herbarum Negative  38. Aspergillus mix Negative   39. Penicillium mix Negative   40. Bipolaris sorokiniana (Helminthosporium) Negative   41. Drechslera spicifera (Curvularia) Negative   42. Mucor plumbeus Negative   43. Fusarium moniliforme Negative   44. Aureobasidium pullulans (pullulara) Negative   45. Rhizopus oryzae Negative   46. Botrytis cinera Negative   47. Epicoccum nigrum Negative   48. Phoma betae Negative   49. Candida Albicans Negative   50. Trichophyton mentagrophytes 3+   51. Mite, D Farinae  5,000 AU/ml Negative   52. Mite, D Pteronyssinus  5,000 AU/ml 2+   53. Cat Hair 10,000 BAU/ml Negative   54.  Dog Epithelia Negative   55. Mixed Feathers Negative   56. Horse Epithelia Negative   57. Cockroach, German Negative   58. Mouse Negative   59. Tobacco Leaf Negative    Control Negative   [place]  Negative   Johnson Negative   7 Grass Negative   Ragweed mix Negative   Weed mix Negative   Tree mix Negative   Mold 1 1+   Mold 2 2+   Mold 3 Negative   Mold 4 Negative   Cat Negative   Dog Negative   Cockroach Negative

## 2021-07-03 ENCOUNTER — Other Ambulatory Visit: Payer: Self-pay

## 2021-07-03 ENCOUNTER — Encounter (HOSPITAL_BASED_OUTPATIENT_CLINIC_OR_DEPARTMENT_OTHER)
Admission: RE | Disposition: A | Discharge status: Home / Self Care | Payer: Self-pay | Source: Home / Self Care | Attending: Otolaryngology

## 2021-07-03 ENCOUNTER — Ambulatory Visit (HOSPITAL_BASED_OUTPATIENT_CLINIC_OR_DEPARTMENT_OTHER)
Admission: RE | Admit: 2021-07-03 | Discharge: 2021-07-03 | Disposition: A | Discharge status: Home / Self Care | Payer: 59 | Attending: Otolaryngology | Admitting: Otolaryngology

## 2021-07-03 ENCOUNTER — Ambulatory Visit (HOSPITAL_BASED_OUTPATIENT_CLINIC_OR_DEPARTMENT_OTHER): Payer: 59 | Admitting: Anesthesiology

## 2021-07-03 ENCOUNTER — Encounter (HOSPITAL_BASED_OUTPATIENT_CLINIC_OR_DEPARTMENT_OTHER): Payer: Self-pay | Admitting: Otolaryngology

## 2021-07-03 DIAGNOSIS — J324 Chronic pansinusitis: Secondary | ICD-10-CM | POA: Insufficient documentation

## 2021-07-03 DIAGNOSIS — J343 Hypertrophy of nasal turbinates: Secondary | ICD-10-CM | POA: Insufficient documentation

## 2021-07-03 DIAGNOSIS — J342 Deviated nasal septum: Secondary | ICD-10-CM | POA: Diagnosis not present

## 2021-07-03 DIAGNOSIS — J3489 Other specified disorders of nose and nasal sinuses: Secondary | ICD-10-CM | POA: Insufficient documentation

## 2021-07-03 HISTORY — PX: SINUS ENDO WITH FUSION: SHX5329

## 2021-07-03 HISTORY — PX: ETHMOIDECTOMY: SHX5197

## 2021-07-03 HISTORY — PX: NASAL SEPTOPLASTY W/ TURBINOPLASTY: SHX2070

## 2021-07-03 HISTORY — DX: Deviated nasal septum: J34.2

## 2021-07-03 HISTORY — PX: FRONTAL SINUS EXPLORATION: SHX6591

## 2021-07-03 HISTORY — PX: MAXILLARY ANTROSTOMY: SHX2003

## 2021-07-03 SURGERY — SEPTOPLASTY, NOSE, WITH NASAL TURBINATE REDUCTION
Anesthesia: General | Site: Nose | Laterality: Bilateral

## 2021-07-03 MED ORDER — DEXAMETHASONE SODIUM PHOSPHATE 4 MG/ML IJ SOLN
INTRAMUSCULAR | Status: DC | PRN
  Administered 2021-07-03: 6 mg via INTRAVENOUS

## 2021-07-03 MED ORDER — ONDANSETRON HCL 4 MG/2ML IJ SOLN
INTRAMUSCULAR | Status: DC | PRN
  Administered 2021-07-03: 4 mg via INTRAVENOUS

## 2021-07-03 MED ORDER — MUPIROCIN 2 % EX OINT
TOPICAL_OINTMENT | CUTANEOUS | Status: AC
  Filled 2021-07-03: qty 22

## 2021-07-03 MED ORDER — LIDOCAINE-EPINEPHRINE 1 %-1:100000 IJ SOLN
INTRAMUSCULAR | Status: DC | PRN
  Administered 2021-07-03: 2 mL

## 2021-07-03 MED ORDER — PROPOFOL 10 MG/ML IV BOLUS
INTRAVENOUS | Status: DC | PRN
  Administered 2021-07-03: 40 mg via INTRAVENOUS
  Administered 2021-07-03: 200 mg via INTRAVENOUS

## 2021-07-03 MED ORDER — PROPOFOL 10 MG/ML IV BOLUS
INTRAVENOUS | Status: AC
  Filled 2021-07-03: qty 40

## 2021-07-03 MED ORDER — MIDAZOLAM HCL 5 MG/5ML IJ SOLN
INTRAMUSCULAR | Status: DC | PRN
  Administered 2021-07-03: 2 mg via INTRAVENOUS

## 2021-07-03 MED ORDER — LIDOCAINE-EPINEPHRINE 1 %-1:100000 IJ SOLN
INTRAMUSCULAR | Status: AC
  Filled 2021-07-03: qty 1

## 2021-07-03 MED ORDER — MUPIROCIN 2 % EX OINT
TOPICAL_OINTMENT | CUTANEOUS | Status: DC | PRN
  Administered 2021-07-03: 1 via TOPICAL

## 2021-07-03 MED ORDER — DEXAMETHASONE SODIUM PHOSPHATE 10 MG/ML IJ SOLN
INTRAMUSCULAR | Status: AC
  Filled 2021-07-03: qty 1

## 2021-07-03 MED ORDER — MIDAZOLAM HCL 2 MG/2ML IJ SOLN
INTRAMUSCULAR | Status: AC
  Filled 2021-07-03: qty 2

## 2021-07-03 MED ORDER — HYDROMORPHONE HCL 1 MG/ML IJ SOLN: 0.2500 mg | INTRAMUSCULAR | Status: DC | PRN

## 2021-07-03 MED ORDER — SODIUM CHLORIDE 0.9 % IR SOLN
Status: DC | PRN
  Administered 2021-07-03: 250 mL

## 2021-07-03 MED ORDER — AZITHROMYCIN 500 MG PO TABS: 500.0000 mg | ORAL_TABLET | Freq: Every day | ORAL | 0 refills | Status: AC

## 2021-07-03 MED ORDER — FENTANYL CITRATE (PF) 100 MCG/2ML IJ SOLN
INTRAMUSCULAR | Status: AC
  Filled 2021-07-03: qty 2

## 2021-07-03 MED ORDER — LACTATED RINGERS IV SOLN: INTRAVENOUS | Status: DC

## 2021-07-03 MED ORDER — FENTANYL CITRATE (PF) 100 MCG/2ML IJ SOLN
INTRAMUSCULAR | Status: DC | PRN
  Administered 2021-07-03: 50 ug via INTRAVENOUS
  Administered 2021-07-03: 100 ug via INTRAVENOUS
  Administered 2021-07-03: 50 ug via INTRAVENOUS

## 2021-07-03 MED ORDER — CLINDAMYCIN PHOSPHATE 900 MG/50ML IV SOLN
INTRAVENOUS | Status: AC
  Filled 2021-07-03: qty 50

## 2021-07-03 MED ORDER — DEXMEDETOMIDINE (PRECEDEX) IN NS 20 MCG/5ML (4 MCG/ML) IV SYRINGE
PREFILLED_SYRINGE | INTRAVENOUS | Status: AC
  Filled 2021-07-03: qty 5

## 2021-07-03 MED ORDER — SUGAMMADEX SODIUM 200 MG/2ML IV SOLN
INTRAVENOUS | Status: DC | PRN
  Administered 2021-07-03: 112.6 mg via INTRAVENOUS

## 2021-07-03 MED ORDER — OXYCODONE-ACETAMINOPHEN 5-325 MG PO TABS: 1.0000 | ORAL_TABLET | ORAL | 0 refills | Status: AC | PRN

## 2021-07-03 MED ORDER — ROCURONIUM BROMIDE 100 MG/10ML IV SOLN
INTRAVENOUS | Status: DC | PRN
  Administered 2021-07-03: 60 mg via INTRAVENOUS
  Administered 2021-07-03 (×2): 10 mg via INTRAVENOUS

## 2021-07-03 MED ORDER — BACITRACIN ZINC 500 UNIT/GM EX OINT
TOPICAL_OINTMENT | CUTANEOUS | Status: AC
  Filled 2021-07-03: qty 28.35

## 2021-07-03 MED ORDER — CLINDAMYCIN PHOSPHATE 900 MG/50ML IV SOLN
INTRAVENOUS | Status: DC | PRN
  Administered 2021-07-03: 900 mg via INTRAVENOUS

## 2021-07-03 MED ORDER — OXYMETAZOLINE HCL 0.05 % NA SOLN
NASAL | Status: AC
  Filled 2021-07-03: qty 30

## 2021-07-03 MED ORDER — OXYMETAZOLINE HCL 0.05 % NA SOLN
NASAL | Status: DC | PRN
  Administered 2021-07-03: 1 via TOPICAL

## 2021-07-03 MED ORDER — LIDOCAINE HCL (PF) 2 % IJ SOLN
INTRAMUSCULAR | Status: DC | PRN
  Administered 2021-07-03: 1 mg via INTRADERMAL

## 2021-07-03 MED ORDER — DEXMEDETOMIDINE (PRECEDEX) IN NS 20 MCG/5ML (4 MCG/ML) IV SYRINGE
PREFILLED_SYRINGE | INTRAVENOUS | Status: DC | PRN
  Administered 2021-07-03 (×4): 4 ug via INTRAVENOUS

## 2021-07-03 SURGICAL SUPPLY — 49 items
BLADE RAD40 ROTATE 4M 4 5PK (BLADE) IMPLANT
BLADE RAD60 ROTATE M4 4 5PK (BLADE) IMPLANT
BLADE ROTATE RAD 12 4 M4 (BLADE) IMPLANT
BLADE ROTATE RAD 40 4 M4 (BLADE) IMPLANT
BLADE ROTATE TRICUT 4X13 M4 (BLADE) ×2 IMPLANT
BLADE TRICUT ROTATE M4 4 5PK (BLADE) IMPLANT
BUR HS RAD FRONTAL 3 (BURR) IMPLANT
CANISTER SUC SOCK COL 7IN (MISCELLANEOUS) ×2 IMPLANT
CANISTER SUCT 1200ML W/VALVE (MISCELLANEOUS) ×4 IMPLANT
COAGULATOR SUCT 8FR VV (MISCELLANEOUS) ×2 IMPLANT
DECANTER SPIKE VIAL GLASS SM (MISCELLANEOUS) IMPLANT
DEFOGGER MIRROR 1QT (MISCELLANEOUS) ×2 IMPLANT
DRSG NASAL KENNEDY LMNT 8CM (GAUZE/BANDAGES/DRESSINGS) IMPLANT
DRSG NASOPORE 8CM (GAUZE/BANDAGES/DRESSINGS) ×2 IMPLANT
DRSG TELFA 3X8 NADH (GAUZE/BANDAGES/DRESSINGS) IMPLANT
ELECT REM PT RETURN 9FT ADLT (ELECTROSURGICAL) ×2
ELECTRODE REM PT RTRN 9FT ADLT (ELECTROSURGICAL) ×1 IMPLANT
GLOVE SURG ENC MOIS LTX SZ7.5 (GLOVE) ×2 IMPLANT
GLOVE SURG POLYISO LF SZ6.5 (GLOVE) ×2 IMPLANT
GLOVE SURG UNDER POLY LF SZ7 (GLOVE) ×2 IMPLANT
GOWN STRL REUS W/ TWL LRG LVL3 (GOWN DISPOSABLE) ×2 IMPLANT
GOWN STRL REUS W/TWL LRG LVL3 (GOWN DISPOSABLE) ×4
HEMOSTAT SURGICEL 2X14 (HEMOSTASIS) IMPLANT
IV NS 500ML (IV SOLUTION) ×2
IV NS 500ML BAXH (IV SOLUTION) ×1 IMPLANT
NEEDLE HYPO 25X1 1.5 SAFETY (NEEDLE) ×2 IMPLANT
NEEDLE SPNL 25GX3.5 QUINCKE BL (NEEDLE) IMPLANT
NS IRRIG 1000ML POUR BTL (IV SOLUTION) ×2 IMPLANT
PACK BASIN DAY SURGERY FS (CUSTOM PROCEDURE TRAY) ×2 IMPLANT
PACK ENT DAY SURGERY (CUSTOM PROCEDURE TRAY) ×2 IMPLANT
SLEEVE SCD COMPRESS KNEE MED (STOCKING) ×2 IMPLANT
SPLINT NASAL AIRWAY SILICONE (MISCELLANEOUS) ×2 IMPLANT
SPONGE GAUZE 2X2 8PLY STRL LF (GAUZE/BANDAGES/DRESSINGS) ×2 IMPLANT
SPONGE NEURO XRAY DETECT 1X3 (DISPOSABLE) ×2 IMPLANT
SUCTION FRAZIER HANDLE 10FR (MISCELLANEOUS)
SUCTION TUBE FRAZIER 10FR DISP (MISCELLANEOUS) IMPLANT
SUT CHROMIC 4 0 P 3 18 (SUTURE) ×2 IMPLANT
SUT PLAIN 4 0 ~~LOC~~ 1 (SUTURE) ×2 IMPLANT
SUT PROLENE 3 0 PS 2 (SUTURE) ×2 IMPLANT
SUT VIC AB 4-0 P-3 18XBRD (SUTURE) IMPLANT
SUT VIC AB 4-0 P3 18 (SUTURE)
SYR 50ML LL SCALE MARK (SYRINGE) ×2 IMPLANT
TOWEL GREEN STERILE FF (TOWEL DISPOSABLE) ×4 IMPLANT
TRACKER ENT INSTRUMENT (MISCELLANEOUS) ×2 IMPLANT
TRACKER ENT PATIENT (MISCELLANEOUS) ×2 IMPLANT
TUBE CONNECTING 20X1/4 (TUBING) ×2 IMPLANT
TUBE SALEM SUMP 16 FR W/ARV (TUBING) ×2 IMPLANT
TUBING STRAIGHTSHOT EPS 5PK (TUBING) ×2 IMPLANT
YANKAUER SUCT BULB TIP NO VENT (SUCTIONS) ×2 IMPLANT

## 2021-07-03 NOTE — Op Note (Signed)
DATE OF PROCEDURE: 07/03/2021  OPERATIVE REPORT   SURGEON: Newman Pies, MD   PREOPERATIVE DIAGNOSES:  1. Bilateral chronic pansinusitis and polyposis. 2. Severe nasal septal deviation.  3. Bilateral inferior turbinate hypertrophy.  4. Chronic nasal obstruction.  POSTOPERATIVE DIAGNOSES:  1. Bilateral chronic pansinusitis and polyposis. 2. Severe nasal septal deviation.  3. Bilateral inferior turbinate hypertrophy.  4. Chronic nasal obstruction.  PROCEDURE PERFORMED:  1. Bilateral endoscopic total ethmoidectomy and sphenoidotomy with polyp removal. 2. Septoplasty. 3. Bilateral partial inferior turbinate resection.  4. Bilateral endoscopic frontal sinusotomy with polyp removal. 5. Bilateral maxillary antrostomy with polyp removal. 6. FUSION stereotactic image guidance.  ANESTHESIA: General endotracheal tube anesthesia.   COMPLICATIONS: None.   ESTIMATED BLOOD LOSS: 550 mL.   INDICATION FOR PROCEDURE: Thomas Page is a 35 y.o. male with a history of chronic nasal obstruction and recurrent sinus infections. The patient was treated with multiple antibiotics, antihistamine, decongestant, systemic steroids and steroid nasal sprays. However, the patient continued to be symptomatic. The patient was treated with 5+ courses of antibiotics this year.  His CT scan showed bilateral pansinusitis, with opacification of all paranasal sinuses and obstructions of all sinus drainage pathways.  The CT also showed nasal septal deviation and bilateral inferior turbinate hypertrophy. Based on the above findings, the decision was made for the patient to undergo the above-stated procedures. The risks, benefits, alternatives, and details of the procedures were discussed with the patient. Questions were invited and answered. Informed consent was obtained.   DESCRIPTION OF PROCEDURE: The patient was taken to the operating room and placed supine on the operating table. General endotracheal tube anesthesia was  administered by the anesthesiologist. The patient was positioned, and prepped and draped in the standard fashion for nasal surgery. Pledgets soaked with Afrin were placed in both nasal cavities for decongestion. The pledgets were subsequently removed. The FUSION stereotactic image guidance marker was placed. The image guidance system was functional throughout the case.  Examination of the nasal cavity revealed a severe nasal septal deviation. 1% lidocaine with 1:100,000 epinephrine was injected onto the nasal septum bilaterally. A hemitransfixion incision was made on the left side. The mucosal flap was carefully elevated on the left side. A cartilaginous incision was made 1 cm superior to the caudal margin of the nasal septum. Mucosal flap was also elevated on the right side in the similar fashion. It should be noted that due to the severe septal deviation, the deviated portion of the cartilaginous and bony septum had to be removed in piecemeal fashion. Once the deviated portions were removed, a straight midline septum was achieved. The septum was then quilted with 4-0 plain gut sutures. The hemitransfixion incision was closed with interrupted 4-0 chromic sutures.   The inferior one half of both hypertrophied inferior turbinate was crossclamped with a Kelly clamp. The inferior one half of each inferior turbinate was then resected with a pair of cross cutting scissors. Hemostasis was achieved with a suction cautery device.   Using a 0 endoscope, the left nasal cavity was examined. A large middle turbinate was noted. Using Tru-Cut forceps, the inferior one third of the middle turbinate was resected. Polypoid tissue was noted within the middle meatus. The polypoid tissue was removed using a combination of microdebrider and Blakesley forceps. The uncinate process was resected with a freer elevator. The maxillary antrum was entered and enlarged using a combination of backbiter and microdebrider. Polypoid tissue  was removed from the left maxillary sinus.  Attention was then focused on  the ethmoid sinuses. The bony partitions of the anterior and posterior ethmoid cavities were taken down. Polypoid tissue was noted and removed.  More polyps were noted to obstruct the left sphenoid opening. The polyps were removed. The sphenoid opening was entered and enlarged. More polyps were removed from within the sphenoid sinus. Attention was then focused on the frontal sinus. The frontal recess was identified and enlarged by removing the surrounding bony partitions. Polypoid tissue was removed from the frontal recess. All 4 paranasal sinuses were copiously irrigated with saline solution.  The same procedure was repeated on the right side without exception. More polyps were noted on the right side. All polyps were removed. Doyle splints were applied to the nasal septum.  The care of the patient was turned over to the anesthesiologist. The patient was awakened from anesthesia without difficulty. The patient was extubated and transferred to the recovery room in good condition.   OPERATIVE FINDINGS: Severe nasal septal deviation and bilateral inferior turbinate hypertrophy.  Bilateral chronic pansinusitis and polyposis.  SPECIMEN: Bilateral sinus contents.   FOLLOWUP CARE: The patient be discharged home once he is awake and alert. The patient will be placed on Percocet 1 tablets p.o. q.4 hours p.r.n. pain, and azithromycin for 3 days. The patient will follow up in my office in 3 days for splint removal.   Elida Harbin Philomena Doheny, MD

## 2021-07-03 NOTE — H&P (Signed)
Cc: Chronic rhinosinusitis, chronic nasal obstruction  HPI: The patient is a 35 year old male who returns today for his follow-up evaluation. The patient was previously seen for recurrent sinus infections.  At his last visit, mucopurulent drainage was noted from the middle meatus bilaterally.  Both inferior turbinates were severely hypertrophied.  The patient was treated with 14 days of Levofloxacin and 6 days of Prednisone.  The patient was also treated with 4 other courses of antibiotics this year.  She subsequently underwent a sinus CT scan.  The CT showed bilateral pansinusitis, with opacification of all paranasal sinuses and obstructions of all sinus drainage pathways.  The CT also showed nasal septal deviation and bilateral inferior turbinate hypertrophy.  The patient returns today complaining of persistent symptoms.  He continues to have significant facial pressure and pain, nasal congestion, and nasal drainage. He is interested in more definitive treatment of his conditions. No other ENT, GI, or respiratory issue noted since the last visit.   Exam: General: Communicates without difficulty, well nourished, no acute distress. Head: Normocephalic, no evidence injury, no tenderness, facial buttresses intact without stepoff. Eyes: PERRL, EOMI. No scleral icterus, conjunctivae clear. Neuro: CN II exam reveals vision grossly intact.  No nystagmus at any point of gaze. Ears: Auricles well formed without lesions.  Ear canals are intact without mass or lesion.  No erythema or edema is appreciated.  The TMs are intact without fluid. Nose: External evaluation reveals normal support and skin without lesions.  Dorsum is intact.  Anterior rhinoscopy reveals congested and edematous mucosa over anterior aspect of the inferior turbinates and deviated nasal septum. Oral:  Oral cavity and oropharynx are intact, symmetric, without erythema or edema.  Mucosa is moist without lesions. Neck: Full range of motion without pain.   There is no significant lymphadenopathy.  No masses palpable.  Thyroid bed within normal limits to palpation.  Parotid glands and submandibular glands equal bilaterally without mass.  Trachea is midline. Neuro:  CN 2-12 grossly intact. Gait normal. Vestibular: No nystagmus at any point of gaze.   Assessment note 1.  Bilateral chronic pansinusitis involving all paranasal sinuses and obstruction of all sinus drainage pathways.   2.  Nasal septal deviation and bilateral inferior turbinate hypertrophy.  3.  The patient has not responded to at least 5 courses of antibiotics this year.   Plan  1.  The physical exam findings and the CT results are reviewed with the patient.  2.  Based on the above findings, the patient will benefit from undergoing surgical intervention with bilateral endoscopic sinus surgery, septoplasty and turbinate reduction.  The risks, benefits, alternatives and details of the procedures are reviewed with the patient.  Questions are invited and answered.  3.  The patient would like to proceed with the procedures.

## 2021-07-03 NOTE — Anesthesia Preprocedure Evaluation (Addendum)
Anesthesia Evaluation  Patient identified by MRN, date of birth, ID band Patient awake    Reviewed: Allergy & Precautions, NPO status , Patient's Chart, lab work & pertinent test results  Airway Mallampati: I  TM Distance: >3 FB     Dental   Pulmonary neg pulmonary ROS,    breath sounds clear to auscultation       Cardiovascular negative cardio ROS   Rhythm:Regular Rate:Normal     Neuro/Psych negative neurological ROS     GI/Hepatic negative GI ROS, Neg liver ROS,   Endo/Other  negative endocrine ROS  Renal/GU negative Renal ROS     Musculoskeletal   Abdominal   Peds  Hematology   Anesthesia Other Findings   Reproductive/Obstetrics                             Anesthesia Physical Anesthesia Plan  ASA: 1  Anesthesia Plan: General   Post-op Pain Management:    Induction: Intravenous  PONV Risk Score and Plan: 2 and Ondansetron, Dexamethasone and Midazolam  Airway Management Planned: Oral ETT  Additional Equipment:   Intra-op Plan:   Post-operative Plan: Extubation in OR  Informed Consent: I have reviewed the patients History and Physical, chart, labs and discussed the procedure including the risks, benefits and alternatives for the proposed anesthesia with the patient or authorized representative who has indicated his/her understanding and acceptance.     Dental advisory given  Plan Discussed with: CRNA, Anesthesiologist and Surgeon  Anesthesia Plan Comments:        Anesthesia Quick Evaluation

## 2021-07-03 NOTE — Transfer of Care (Signed)
Immediate Anesthesia Transfer of Care Note  Patient: Thomas Page  Procedure(s) Performed: NASAL SEPTOPLASTY WITH  BILATERAL TURBINATE REDUCTION (Bilateral: Nose) BILATERAL MAXILLARY ANTROSTOMY WITH TISSUE REMOVAL (Bilateral: Nose) BILATERAL FRONTAL SINUS EXPLORATION (Bilateral: Nose) BILATERAL ETHMOIDECTOMY AND SPHENOIDECTOMY WITH TISSUE REMOVAL (Bilateral: Nose) SINUS ENDOSCOPY WITH STEALTH NAVIGATION (Bilateral: Nose)  Patient Location: PACU  Anesthesia Type:General  Level of Consciousness: drowsy  Airway & Oxygen Therapy: Patient Spontanous Breathing and Patient connected to face mask oxygen  Post-op Assessment: Report given to RN, Post -op Vital signs reviewed and stable and Patient moving all extremities X 4  Post vital signs: Reviewed and stable  Last Vitals:  Vitals Value Taken Time  BP 131/79 07/03/21 1140  Temp    Pulse 102 07/03/21 1141  Resp 18 07/03/21 1141  SpO2 99 % 07/03/21 1141  Vitals shown include unvalidated device data.  Last Pain:  Vitals:   07/03/21 0709  TempSrc: Oral  PainSc: 0-No pain      Patients Stated Pain Goal: 3 (07/03/21 0709)  Complications: No notable events documented.

## 2021-07-03 NOTE — Anesthesia Procedure Notes (Signed)
Procedure Name: Intubation Date/Time: 07/03/2021 8:38 AM Performed by: Ezequiel Kayser, CRNA Pre-anesthesia Checklist: Patient identified, Emergency Drugs available, Suction available and Patient being monitored Patient Re-evaluated:Patient Re-evaluated prior to induction Oxygen Delivery Method: Circle System Utilized Preoxygenation: Pre-oxygenation with 100% oxygen Induction Type: IV induction Ventilation: Mask ventilation without difficulty Laryngoscope Size: Mac and 3 Grade View: Grade I Tube type: Oral Rae Tube size: 8.0 mm Number of attempts: 1 Airway Equipment and Method: Stylet and Oral airway Placement Confirmation: ETT inserted through vocal cords under direct vision, positive ETCO2 and breath sounds checked- equal and bilateral Secured at: 21 cm Tube secured with: Tape Dental Injury: Teeth and Oropharynx as per pre-operative assessment

## 2021-07-03 NOTE — Discharge Instructions (Addendum)

## 2021-07-04 ENCOUNTER — Encounter (HOSPITAL_BASED_OUTPATIENT_CLINIC_OR_DEPARTMENT_OTHER): Payer: Self-pay | Admitting: Otolaryngology

## 2021-07-04 LAB — SURGICAL PATHOLOGY

## 2021-07-05 NOTE — Anesthesia Postprocedure Evaluation (Signed)
Anesthesia Post Note  Patient: Thomas Page  Procedure(s) Performed: NASAL SEPTOPLASTY WITH  BILATERAL TURBINATE REDUCTION (Bilateral: Nose) BILATERAL MAXILLARY ANTROSTOMY WITH TISSUE REMOVAL (Bilateral: Nose) BILATERAL FRONTAL SINUS EXPLORATION (Bilateral: Nose) BILATERAL ETHMOIDECTOMY AND SPHENOIDECTOMY WITH TISSUE REMOVAL (Bilateral: Nose) SINUS ENDOSCOPY WITH STEALTH NAVIGATION (Bilateral: Nose)     Patient location during evaluation: PACU Anesthesia Type: General Level of consciousness: awake and alert Pain management: pain level controlled Vital Signs Assessment: post-procedure vital signs reviewed and stable Respiratory status: spontaneous breathing, nonlabored ventilation, respiratory function stable and patient connected to nasal cannula oxygen Cardiovascular status: blood pressure returned to baseline and stable Postop Assessment: no apparent nausea or vomiting Anesthetic complications: no   No notable events documented.  Last Vitals:  Vitals:   07/03/21 1215 07/03/21 1228  BP: (!) 128/99 (!) 143/84  Pulse: 79 86  Resp: 15 16  Temp:  37.7 C  SpO2: 96% 95%    Last Pain:  Vitals:   07/03/21 1228  TempSrc:   PainSc: 0-No pain   Pain Goal: Patients Stated Pain Goal: 3 (07/03/21 0709)                 Shelton Silvas

## 2021-07-24 ENCOUNTER — Other Ambulatory Visit: Payer: Self-pay

## 2021-07-24 ENCOUNTER — Encounter: Payer: Self-pay | Admitting: Family Medicine

## 2021-07-24 ENCOUNTER — Ambulatory Visit (INDEPENDENT_AMBULATORY_CARE_PROVIDER_SITE_OTHER): Payer: 59 | Admitting: Family Medicine

## 2021-07-24 VITALS — BP 130/88 | HR 87 | Temp 98.7°F | Resp 16 | Ht 68.0 in | Wt 126.4 lb

## 2021-07-24 DIAGNOSIS — Z889 Allergy status to unspecified drugs, medicaments and biological substances status: Secondary | ICD-10-CM | POA: Diagnosis not present

## 2021-07-24 NOTE — Progress Notes (Signed)
[address] [place] [address] Dept: [phone]  FOLLOW UP NOTE  Patient ID: Thomas Page, male    DOB: 1986-02-14  Age: 35 y.o. MRN: [id number] Date of Office Visit: 07/24/2021  Assessment  Chief Complaint: Food/Drug Challenge (Penicillin)  HPI Thomas Page is a 35 year old male who presents to the clinic for an in office oral drug challenge to penicillin. He was last seen in this clinic on 06/28/2021 by Dr. Dellis Anes for evaluation of allergic rhinitis and drug allergy to penicillin. In the interim, he had a nasal surgery with Dr. Suszanne Conners on 07/03/2021 with significant relief of symptoms of chronic congestion. At today's visit, he reports he is feeling well overall and has not had any antihistamines for the last 3 days. He denies cardiopulmonary, integumentary, and gastrointestinal symptoms. He reports that his initial reaction to penicillin occurred in childhood and he thinks that he developed a rash. His current medications are listed in the chart.    Drug Allergies:  Allergies  Allergen Reactions   Penicillins Hives   Amoxil [Amoxicillin] Rash    Physical Exam: BP 130/88   Pulse 87   Temp 98.7 F (37.1 C) (Temporal)   Resp 16   Ht 5\' 8"  (1.727 m)   Wt 126 lb 6.4 oz (57.3 kg)   SpO2 98%   BMI 19.22 kg/m    Physical Exam Vitals reviewed.  Constitutional:      Appearance: Normal appearance.  HENT:     Head: Normocephalic and atraumatic.     Right Ear: Tympanic membrane normal.     Left Ear: Tympanic membrane normal.     Nose:     Comments: Bilateral nares normal. Pharynx normal. Ears normal. Eyes normal.    Mouth/Throat:     Pharynx: Oropharynx is clear.  Eyes:     Conjunctiva/sclera: Conjunctivae normal.  Cardiovascular:     Rate and Rhythm: Normal rate and regular rhythm.     Heart sounds: Normal heart sounds. No murmur heard. Pulmonary:     Effort: Pulmonary effort is normal.     Breath sounds: Normal breath sounds.   Musculoskeletal:        General: Normal range of motion.     Cervical back: Normal range of motion and neck supple.  Skin:    General: Skin is warm and dry.  Neurological:     Mental Status: He is alert and oriented to person, place, and time.  Psychiatric:        Mood and Affect: Mood normal.        Behavior: Behavior normal.        Thought Content: Thought content normal.        Judgment: Judgment normal.     Procedure note: Written consent obtained Open graded penicillin oral challenge: The patient was able to tolerate the challenge today without adverse signs or symptoms. Vital signs were stable throughout the challenge and observation period. He received 3 doses separated by 30 minutes, each of which was separated by vitals and a brief physical exam. He received the following doses: 0.1 mL, 1 mL, and 10 mL for a total of 500 mg. He was monitored for 30 minutes following the last dose.   The patient was able to tolerate the open graded oral challenge today without adverse signs or symptoms. Therefore, he has the same risk of systemic reaction associated with  the use of penicillin  as the general population.   Assessment and Plan:  1. Drug allergy     Patient Instructions  In office oral penicillin challenge Thomas Page was able to tolerate the penicillin challenge today at the office without adverse signs or symptoms of an allergic reaction. Therefore, he has the same risk of systemic reaction associated with the consumption of penicillin as the general population.  - Do not give any penicillin  for the next 24 hours. - Monitor for allergic symptoms such as rash, wheezing, diarrhea, swelling, and vomiting for the next 24 hours. If severe symptoms occur call 911. For less severe symptoms treat with Benadryl 50 mg every 4 hours and call the clinic.   Call the clinic if this treatment plan is not working well for you  Follow up in 6-9 months or sooner if needed.  Return in  about 6 months (around 01/21/2022), or if symptoms worsen or fail to improve.    Thank you for the opportunity to care for this patient.  Please do not hesitate to contact me with questions.  Thermon Leyland, FNP [facility]

## 2021-07-24 NOTE — Patient Instructions (Addendum)
In office oral penicillin challenge Thomas Page was able to tolerate the penicillin challenge today at the office without adverse signs or symptoms of an allergic reaction. Therefore, he has the same risk of systemic reaction associated with the consumption of penicillin as the general population.  - Do not give any penicillin  for the next 24 hours. - Monitor for allergic symptoms such as rash, wheezing, diarrhea, swelling, and vomiting for the next 24 hours. If severe symptoms occur call 911. For less severe symptoms treat with Benadryl 50 mg every 4 hours and call the clinic.   Call the clinic if this treatment plan is not working well for you  Follow up in 6-9 months or sooner if needed.

## 2021-10-31 ENCOUNTER — Ambulatory Visit: Payer: 59 | Admitting: Family Medicine

## 2022-01-16 ENCOUNTER — Other Ambulatory Visit: Payer: Self-pay | Admitting: Family Medicine

## 2022-02-28 ENCOUNTER — Telehealth: Payer: Self-pay | Admitting: Family Medicine

## 2022-02-28 MED ORDER — BUPROPION HCL ER (XL) 300 MG PO TB24: 300.0000 mg | ORAL_TABLET | Freq: Every day | ORAL | 0 refills | Status: DC

## 2022-02-28 NOTE — Telephone Encounter (Signed)
Patient requesting refill on wellibutin XL 300 mg was last seen 05/02/21 was told at last refill to schedule appointment last 01/16/22 needs just enough until appointment 5/30 was told needs to keep appointment. [place] [facility] ?

## 2022-02-28 NOTE — Telephone Encounter (Signed)
Refill sent to pharmacy and pt is aware. 

## 2022-02-28 NOTE — Telephone Encounter (Signed)
May have 90-day ?Please keep follow-up visit toward the end of May ?

## 2022-04-10 ENCOUNTER — Ambulatory Visit (INDEPENDENT_AMBULATORY_CARE_PROVIDER_SITE_OTHER): Payer: 59 | Admitting: Family Medicine

## 2022-04-10 VITALS — BP 132/82 | HR 74 | Temp 98.2°F | Ht 68.0 in | Wt 128.0 lb

## 2022-04-10 DIAGNOSIS — Z789 Other specified health status: Secondary | ICD-10-CM

## 2022-04-10 DIAGNOSIS — F411 Generalized anxiety disorder: Secondary | ICD-10-CM

## 2022-04-10 DIAGNOSIS — Z79899 Other long term (current) drug therapy: Secondary | ICD-10-CM | POA: Diagnosis not present

## 2022-04-10 DIAGNOSIS — Z13220 Encounter for screening for lipoid disorders: Secondary | ICD-10-CM | POA: Diagnosis not present

## 2022-04-10 MED ORDER — BUPROPION HCL ER (XL) 300 MG PO TB24: 300.0000 mg | ORAL_TABLET | Freq: Every day | ORAL | 3 refills | Status: DC

## 2022-04-10 NOTE — Progress Notes (Signed)
   Subjective:    Patient ID: Thomas Page, male    DOB: [birth year], 36 y.o.   MRN: [id number]  HPI Patient has underlying anxiety issues Takes his medicine states overall it does a good job for him Denies any major setbacks or problems He does relate alcohol use but denies frequent or excessive use Feels his moods overall are doing well otherwise Patient here for follow up on anxiety medication.  Review of Systems     Objective:   Physical Exam  General-in no acute distress Eyes-no discharge Lungs-respiratory rate normal, CTA CV-no murmurs,RRR Extremities skin warm dry no edema Neuro grossly normal Behavior normal, alert       Assessment & Plan:   1. High risk medication use Check labs  - Hepatic function panel - Lipid Profile - Basic Metabolic Panel (BMET)  2. Alcohol use Patient working hard trying to keep alcohol use to a minimum Denies excessive use - Hepatic function panel - Lipid Profile - Basic Metabolic Panel (BMET) He has been counseled to try to avoid using it frequently 3. Screening, lipid Cholesterol profile ordered - Hepatic function panel - Lipid Profile - Basic Metabolic Panel (BMET)  4. Generalized anxiety disorder Continue Wellbutrin. Behavioral techniques to improve sleep was discussed  PSA not to age 16 Colorectal screening not until age 43 no premature family history of either

## 2022-04-12 ENCOUNTER — Encounter: Payer: Self-pay | Admitting: Family Medicine

## 2022-04-12 LAB — HEPATIC FUNCTION PANEL
ALT: 22 IU/L (ref 0–44)
AST: 17 IU/L (ref 0–40)
Albumin: 4.9 g/dL (ref 4.0–5.0)
Alkaline Phosphatase: 89 IU/L (ref 44–121)
Bilirubin Total: 0.8 mg/dL (ref 0.0–1.2)
Bilirubin, Direct: 0.16 mg/dL (ref 0.00–0.40)
Total Protein: 7.2 g/dL (ref 6.0–8.5)

## 2022-04-12 LAB — BASIC METABOLIC PANEL WITH GFR
BUN/Creatinine Ratio: 16 (ref 9–20)
BUN: 14 mg/dL (ref 6–20)
CO2: 25 mmol/L (ref 20–29)
Calcium: 9.6 mg/dL (ref 8.7–10.2)
Chloride: 100 mmol/L (ref 96–106)
Creatinine, Ser: 0.85 mg/dL (ref 0.76–1.27)
Glucose: 92 mg/dL (ref 70–99)
Potassium: 4.1 mmol/L (ref 3.5–5.2)
Sodium: 138 mmol/L (ref 134–144)
eGFR: 115 mL/min/1.73

## 2022-04-12 LAB — LIPID PANEL
Chol/HDL Ratio: 3.3 ratio (ref 0.0–5.0)
Cholesterol, Total: 193 mg/dL (ref 100–199)
HDL: 59 mg/dL
LDL Chol Calc (NIH): 114 mg/dL — ABNORMAL HIGH (ref 0–99)
Triglycerides: 114 mg/dL (ref 0–149)
VLDL Cholesterol Cal: 20 mg/dL (ref 5–40)

## 2022-10-14 IMAGING — CT CT MAXILLOFACIAL W/O CM
3 series · 15 of 47 positions shown, 18 images · non-contrast
Comparison: None.

CLINICAL DATA: Chronic sinusitis

EXAM:
CT MAXILLOFACIAL WITHOUT CONTRAST
TECHNIQUE: Multidetector CT images of the paranasal sinuses were obtained using
the standard protocol without intravenous contrast.

[Series 2: standard · axial · 0.35mm/px · z∈[+1266,+1368]mm · 9 of 120 slices shown, 12 images]
[im 9/120  brain]
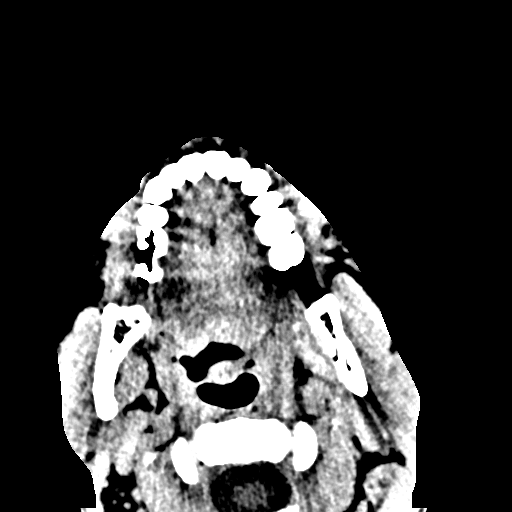
[im 9/120  bone]
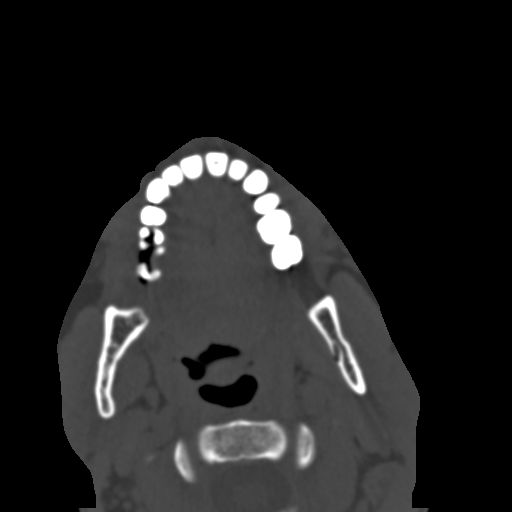
[im 21/120  bone]
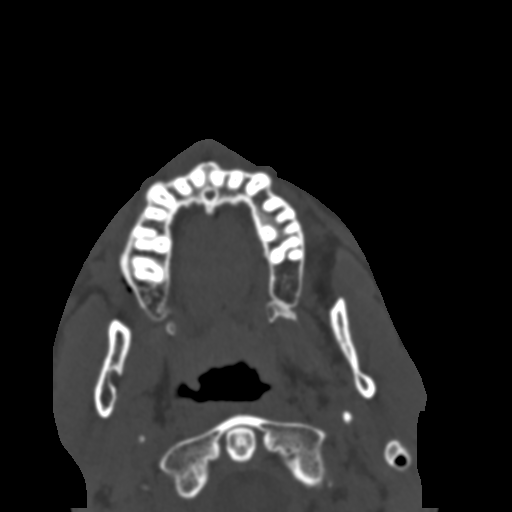
[im 33/120  bone]
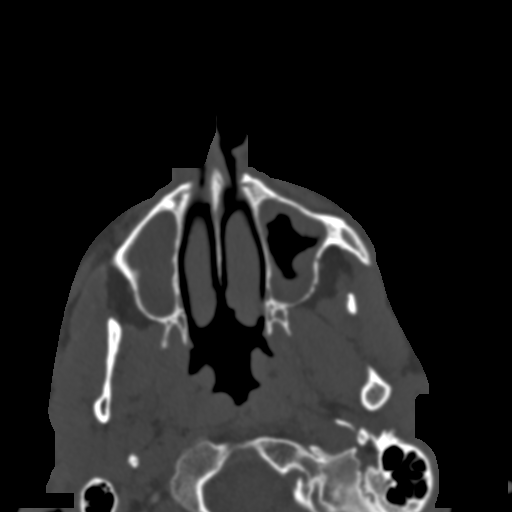
[im 46/120  bone]
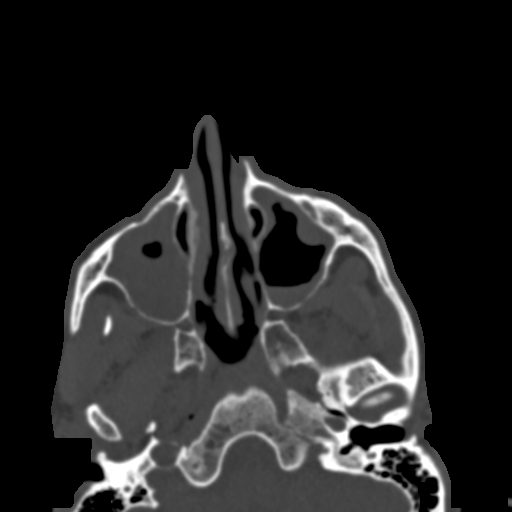
[im 62/120  brain]
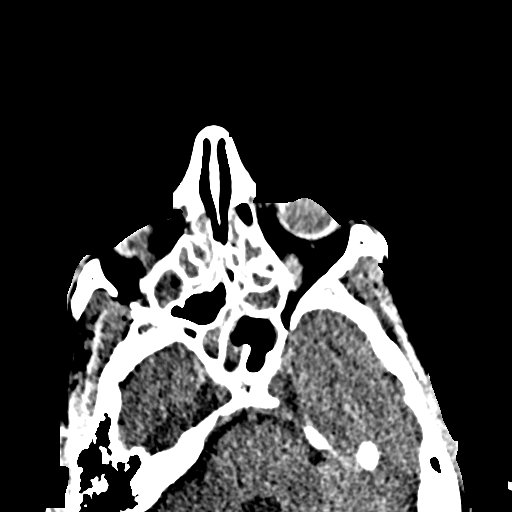
[im 62/120  bone]
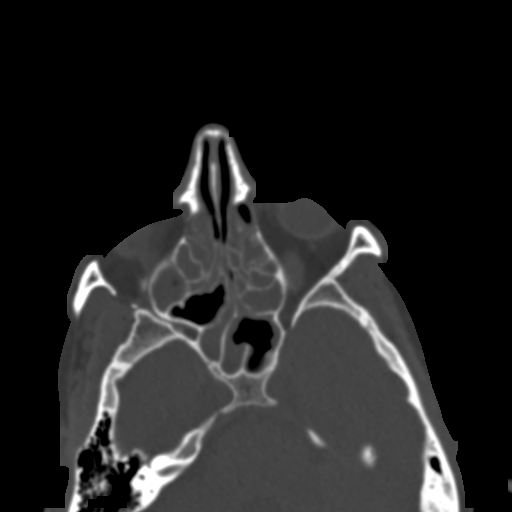
[im 74/120  bone]
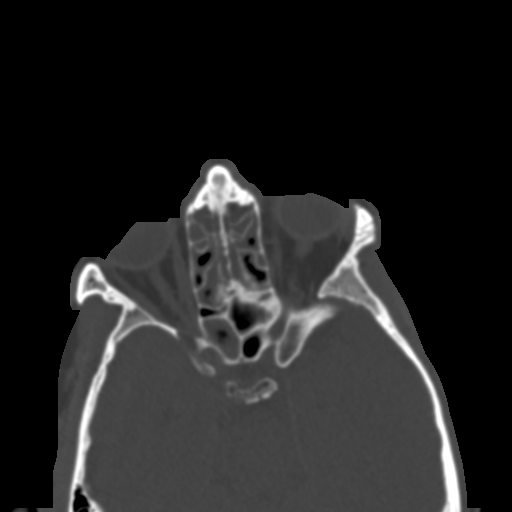
[im 87/120  bone]
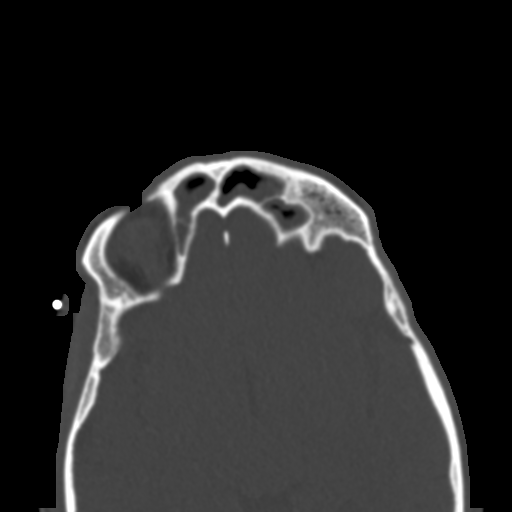
[im 99/120  bone]
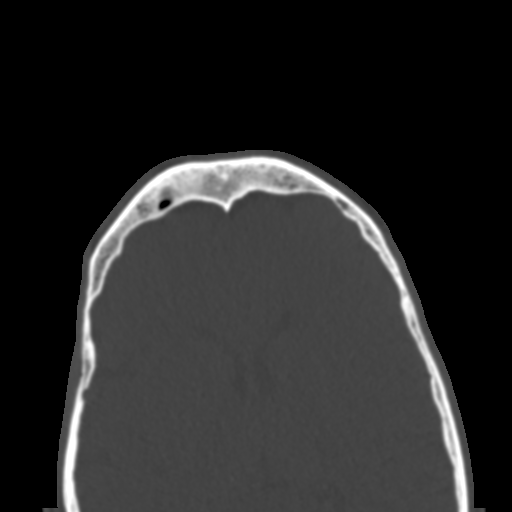
[im 111/120  brain]
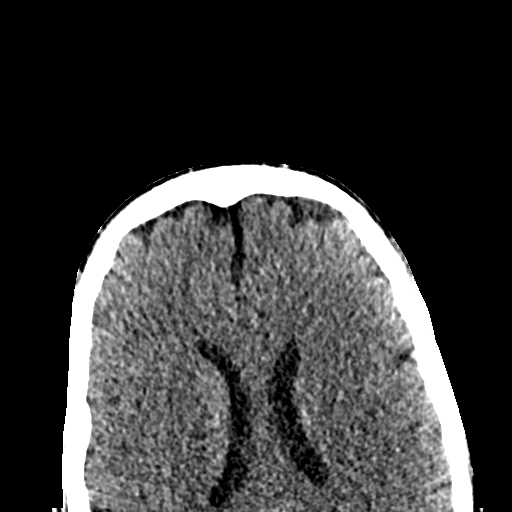
[im 111/120  bone]
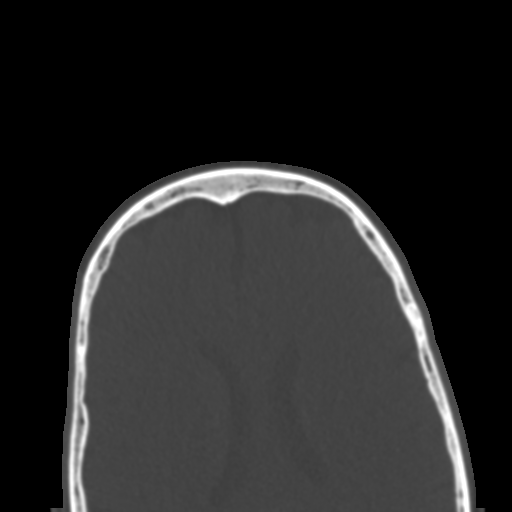

[Series 4: coronal · coronal · 0.28mm/px · 3 of 91 slices shown]
[im 31/91  bone]
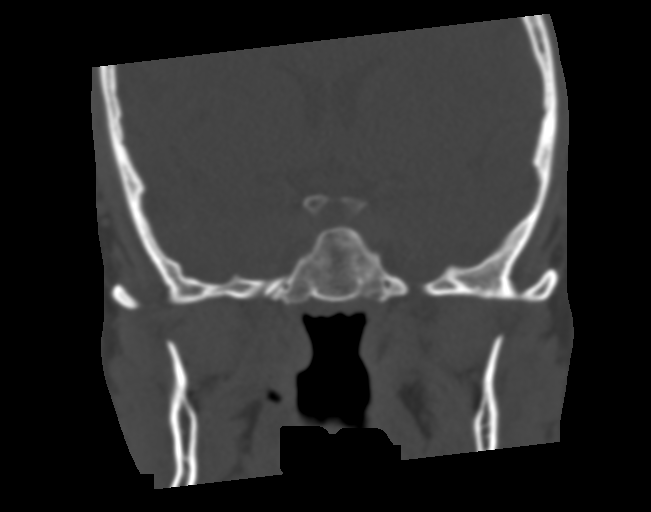
[im 41/91  bone]
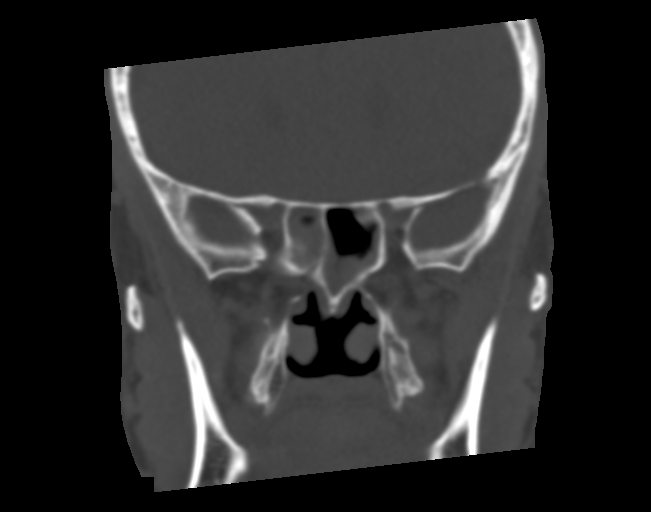
[im 51/91  bone]
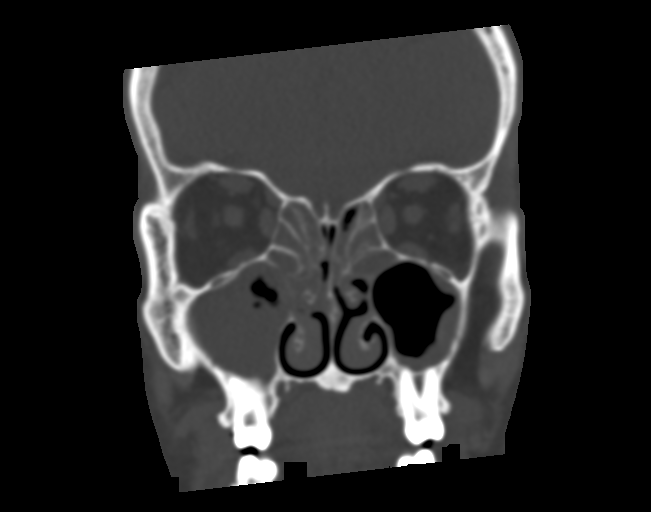

[Series 5: sagittal · sagittal · 0.28mm/px · 3 of 89 slices shown]
[im 30/89  bone]
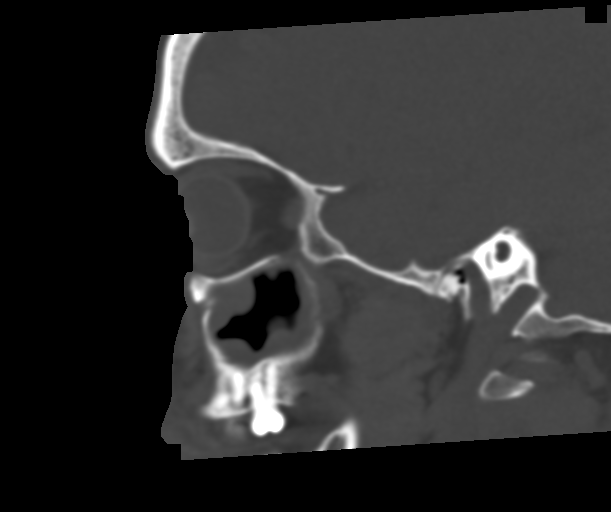
[im 45/89  bone]
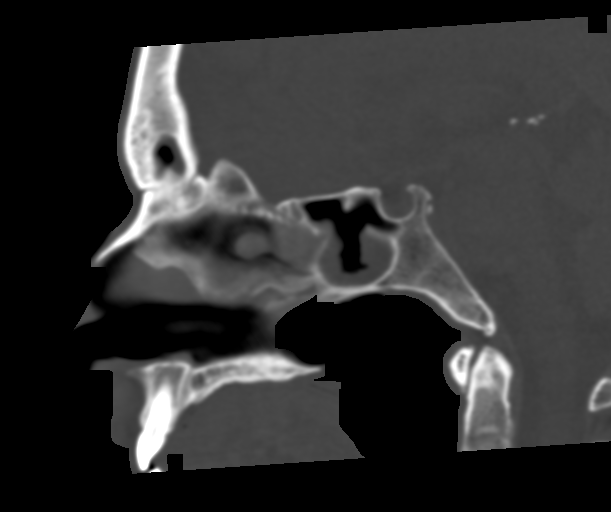
[im 59/89  bone]
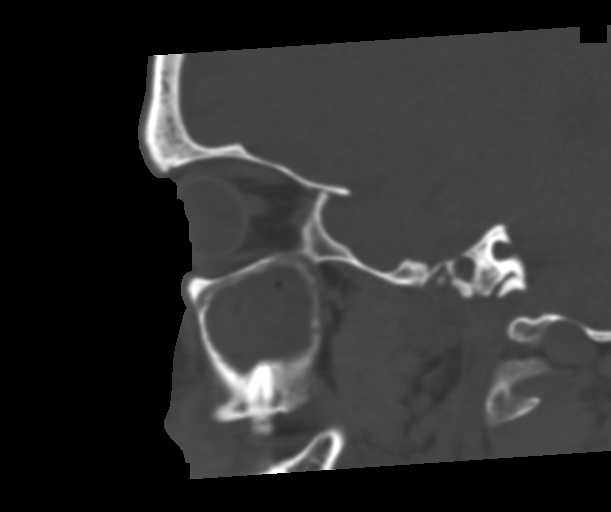

[15 of 47 positions shown; findings below may reference images not displayed]

FINDINGS: Paranasal sinuses:

Frontal: Mild bilateral mucosal thickening. Both frontal recesses
are occluded

Ethmoid: Diffuse moderate mucosal thickening.

Maxillary: Severe right and moderate left mucosal thickening. Bubbly
secretions in maxillary sinus.

Sphenoid: Moderate bilateral mucosal thickening with occlusion both
sphenoethmoidal recesses.

Right ostiomeatal unit: Occluded

Left ostiomeatal unit: Occluded

Nasal passages: Patent. Mild leftward septal deviation leftward
projecting spur. Spur contacts the inferior turbinate.

Anatomy: No pneumatization superior to anterior ethmoid notches.
Symmetric and intact olfactory grooves and fovea ethmoidalis, Keros
I (1-3mm). Presellar sphenoid pneumatization pattern. No dehiscence
of carotid or optic canals. No onodi cell.

Other: Orbits and intracranial compartment are unremarkable. Visible
mastoid air cells are normally aerated.
IMPRESSION: 1. Moderate to severe paranasal sinus mucosal thickening with
occlusion of all sinus drainage pathways.
2. Mild leftward septal deviation and leftward projecting spur.

## 2022-12-25 DIAGNOSIS — J343 Hypertrophy of nasal turbinates: Secondary | ICD-10-CM | POA: Diagnosis not present

## 2022-12-25 DIAGNOSIS — J324 Chronic pansinusitis: Secondary | ICD-10-CM | POA: Diagnosis not present

## 2023-06-25 DIAGNOSIS — J343 Hypertrophy of nasal turbinates: Secondary | ICD-10-CM | POA: Diagnosis not present

## 2023-06-25 DIAGNOSIS — J324 Chronic pansinusitis: Secondary | ICD-10-CM | POA: Diagnosis not present

## 2023-07-05 ENCOUNTER — Other Ambulatory Visit: Payer: Self-pay | Admitting: Family Medicine

## 2023-07-08 ENCOUNTER — Telehealth: Payer: Self-pay | Admitting: Family Medicine

## 2023-07-08 DIAGNOSIS — J302 Other seasonal allergic rhinitis: Secondary | ICD-10-CM

## 2023-07-08 MED ORDER — BUPROPION HCL ER (XL) 300 MG PO TB24: 300.0000 mg | ORAL_TABLET | Freq: Every day | ORAL | 0 refills | Status: DC

## 2023-07-08 NOTE — Telephone Encounter (Signed)
Patient requesting refill on buPROPion (WELLBUTRIN XL) 300 MG 24 hr tablet  states completely out. [employer]

## 2023-07-08 NOTE — Telephone Encounter (Signed)
Patient scheduled follow up office visit. Prescription sent electronically to pharmacy.

## 2023-08-13 ENCOUNTER — Ambulatory Visit: Payer: Self-pay | Admitting: Family Medicine

## 2023-08-13 ENCOUNTER — Encounter: Payer: Self-pay | Admitting: Family Medicine

## 2023-08-13 VITALS — BP 124/86 | HR 83 | Temp 98.4°F | Ht 68.0 in | Wt 125.2 lb

## 2023-08-13 DIAGNOSIS — Z23 Encounter for immunization: Secondary | ICD-10-CM

## 2023-08-13 DIAGNOSIS — F411 Generalized anxiety disorder: Secondary | ICD-10-CM

## 2023-08-13 DIAGNOSIS — E785 Hyperlipidemia, unspecified: Secondary | ICD-10-CM

## 2023-08-13 DIAGNOSIS — Z79899 Other long term (current) drug therapy: Secondary | ICD-10-CM

## 2023-08-13 MED ORDER — BUPROPION HCL ER (XL) 300 MG PO TB24: 300.0000 mg | ORAL_TABLET | Freq: Every day | ORAL | 3 refills | Status: DC

## 2023-08-13 NOTE — Progress Notes (Signed)
   Subjective:    Patient ID: Thomas Page, male    DOB: [birth year], 37 y.o.   MRN: [id number]  HPI  Patient under a lot of stress May be going through job changes Finds himself a time feeling anxious Denies being depressed He is motivated to try to learn new possible job skills Medicine does do a good job for him Does need lab work   Review of Systems     Objective:   Physical Exam  General-in no acute distress Eyes-no discharge Lungs-respiratory rate normal, CTA CV-no murmurs,RRR Extremities skin warm dry no edema Neuro grossly normal Behavior normal, alert  Patient was counseled regarding handling stress and making sure that he does not find himself self-medicating with alcohol he states he has been doing a good job staying away     Assessment & Plan:   1. Immunization due Flu shot today  - Flu vaccine trivalent PF, 6mos and older(Flulaval,Afluria,Fluarix,Fluzone)  2. Generalized anxiety disorder He states Wellbutrin does a good job for him we will continue with   3. High risk medication use Lab work ordered  - Hepatic Function Panel - Basic Metabolic Panel  4. Hyperlipidemia, unspecified hyperlipidemia type Lab work ordered [facility] Follow-up 6 months for general medicine review sooner if any problems - Lipid Panel

## 2023-08-20 DIAGNOSIS — E785 Hyperlipidemia, unspecified: Secondary | ICD-10-CM | POA: Diagnosis not present

## 2023-08-20 DIAGNOSIS — Z79899 Other long term (current) drug therapy: Secondary | ICD-10-CM | POA: Diagnosis not present

## 2023-08-21 LAB — HEPATIC FUNCTION PANEL
ALT: 18 [IU]/L (ref 0–44)
AST: 18 [IU]/L (ref 0–40)
Albumin: 4.9 g/dL (ref 4.1–5.1)
Alkaline Phosphatase: 82 [IU]/L (ref 44–121)
Bilirubin Total: 0.9 mg/dL (ref 0.0–1.2)
Bilirubin, Direct: 0.19 mg/dL (ref 0.00–0.40)
Total Protein: 7.1 g/dL (ref 6.0–8.5)

## 2023-08-21 LAB — BASIC METABOLIC PANEL
BUN/Creatinine Ratio: 14 (ref 9–20)
BUN: 14 mg/dL (ref 6–20)
CO2: 23 mmol/L (ref 20–29)
Calcium: 9.8 mg/dL (ref 8.7–10.2)
Chloride: 101 mmol/L (ref 96–106)
Creatinine, Ser: 0.99 mg/dL (ref 0.76–1.27)
Glucose: 90 mg/dL (ref 70–99)
Potassium: 4.4 mmol/L (ref 3.5–5.2)
Sodium: 139 mmol/L (ref 134–144)
eGFR: 101 mL/min/{1.73_m2} (ref >59)

## 2023-08-21 LAB — LIPID PANEL
Chol/HDL Ratio: 3.3 {ratio} (ref 0.0–5.0)
Cholesterol, Total: 206 mg/dL — ABNORMAL HIGH (ref 100–199)
HDL: 63 mg/dL (ref >39)
LDL Chol Calc (NIH): 127 mg/dL — ABNORMAL HIGH (ref 0–99)
Triglycerides: 88 mg/dL (ref 0–149)
VLDL Cholesterol Cal: 16 mg/dL (ref 5–40)

## 2023-12-23 ENCOUNTER — Telehealth (INDEPENDENT_AMBULATORY_CARE_PROVIDER_SITE_OTHER): Payer: Self-pay | Admitting: Otolaryngology

## 2023-12-23 NOTE — Telephone Encounter (Signed)
 confirm appt & location [phone]

## 2023-12-24 ENCOUNTER — Encounter (INDEPENDENT_AMBULATORY_CARE_PROVIDER_SITE_OTHER): Payer: Self-pay

## 2023-12-24 ENCOUNTER — Ambulatory Visit (INDEPENDENT_AMBULATORY_CARE_PROVIDER_SITE_OTHER): Payer: BLUE CROSS/BLUE SHIELD | Admitting: Otolaryngology

## 2023-12-24 VITALS — BP 149/96 | HR 68 | Ht 67.5 in | Wt 125.0 lb

## 2023-12-24 DIAGNOSIS — R0982 Postnasal drip: Secondary | ICD-10-CM | POA: Diagnosis not present

## 2023-12-24 DIAGNOSIS — J329 Chronic sinusitis, unspecified: Secondary | ICD-10-CM

## 2023-12-24 DIAGNOSIS — J0101 Acute recurrent maxillary sinusitis: Secondary | ICD-10-CM

## 2023-12-24 MED ORDER — AMOXICILLIN-POT CLAVULANATE 875-125 MG PO TABS: 1.0000 | ORAL_TABLET | Freq: Two times a day (BID) | ORAL | 0 refills | Status: AC

## 2023-12-25 DIAGNOSIS — J0101 Acute recurrent maxillary sinusitis: Secondary | ICD-10-CM | POA: Insufficient documentation

## 2023-12-25 NOTE — Progress Notes (Signed)
Patient ID: Thomas Page, male   DOB: [birth year], 38 y.o.   MRN: [id number]  Follow-up: Chronic rhinosinusitis, polyposis, nasal obstruction  HPI: The patient is a 38 year old male who returns today for his follow-up evaluation.  The patient has a history of chronic rhinosinusitis, polyposis, and nasal obstruction.  He underwent bilateral endoscopic sinus surgery, septoplasty, and turbinate reduction in August 2022.  According to the patient, he was doing well till 2 weeks ago, when he started experiencing nasal congestion and mucopurulent drainage.  He is not on any antibiotic at this time.  He denies any fever or visual change.  Exam: General: Communicates without difficulty, well nourished, no acute distress. Head: Normocephalic, no evidence injury, no tenderness, facial buttresses intact without stepoff. Face/sinus: No tenderness to palpation and percussion. Facial movement is normal and symmetric. Eyes: PERRL, EOMI. No scleral icterus, conjunctivae clear. Neuro: CN II exam reveals vision grossly intact.  No nystagmus at any point of gaze. Ears: Auricles well formed without lesions.  Ear canals are intact without mass or lesion.  No erythema or edema is appreciated.  The TMs are intact without fluid. Nose: External evaluation reveals normal support and skin without lesions.  Dorsum is intact.  Anterior rhinoscopy reveals congested mucosa over anterior aspect of inferior turbinates and intact septum.  Mucopurulent drainage is noted from the nasal cavities.  Oral:  Oral cavity and oropharynx are intact, symmetric, without erythema or edema.  Mucosa is moist without lesions. Neck: Full range of motion without pain.  There is no significant lymphadenopathy.  No masses palpable.  Thyroid bed within normal limits to palpation.  Parotid glands and submandibular glands equal bilaterally without mass.  Trachea is midline. Neuro:  CN 2-12 grossly intact.    Assessment: 1.  Bilateral recurrent sinusitis, with  mucopurulent drainage noted bilaterally. 2.  He septum and turbinates are well-healed.  Plan: 1.  The physical exam findings are reviewed with the patient. 2.  Continue with Flonase nasal spray and nasal saline irrigation. 3.  Augmentin 875 mg p.o. twice daily for 1 week. 4.  The patient will return for reevaluation in 3 weeks.

## 2024-01-15 ENCOUNTER — Ambulatory Visit (INDEPENDENT_AMBULATORY_CARE_PROVIDER_SITE_OTHER): Payer: BLUE CROSS/BLUE SHIELD | Admitting: Otolaryngology

## 2024-01-15 ENCOUNTER — Encounter (INDEPENDENT_AMBULATORY_CARE_PROVIDER_SITE_OTHER): Payer: Self-pay

## 2024-01-15 VITALS — BP 149/90 | HR 81 | Ht 68.0 in | Wt 125.0 lb

## 2024-01-15 DIAGNOSIS — Z09 Encounter for follow-up examination after completed treatment for conditions other than malignant neoplasm: Secondary | ICD-10-CM

## 2024-01-15 DIAGNOSIS — Z8669 Personal history of other diseases of the nervous system and sense organs: Secondary | ICD-10-CM | POA: Diagnosis not present

## 2024-01-15 DIAGNOSIS — J3089 Other allergic rhinitis: Secondary | ICD-10-CM

## 2024-01-15 DIAGNOSIS — J0101 Acute recurrent maxillary sinusitis: Secondary | ICD-10-CM

## 2024-01-18 NOTE — Progress Notes (Signed)
 Patient ID: Thomas Page, male   DOB: 03-15-1986, 38 y.o.   MRN: [id number]  Follow-up: Chronic rhinosinusitis, polyposis, nasal obstruction   HPI: The patient is a 38 year old male who returns today for his follow-up evaluation.  The patient was previously seen for recurrent sinusitis, sinonasal polyps, and chronic nasal obstruction.  He underwent bilateral endoscopic sinus surgery, septoplasty, and turbinate reduction surgery in August 2022.  At his last visit 3 weeks ago, he was noted to have bilateral acute sinusitis, with mucopurulent drainage.  He was treated with Augmentin, nasal saline irrigation, and Flonase nasal spray.  The patient returns today reporting significant improvement in his symptoms.  His nasal congestion and facial pressure have resolved.  He is able to breathe through both nostrils.  Exam: General: Communicates without difficulty, well nourished, no acute distress. Head: Normocephalic, no evidence injury, no tenderness, facial buttresses intact without stepoff. Face/sinus: No tenderness to palpation and percussion. Facial movement is normal and symmetric. Eyes: PERRL, EOMI. No scleral icterus, conjunctivae clear. Neuro: CN II exam reveals vision grossly intact.  No nystagmus at any point of gaze. Ears: Auricles well formed without lesions.  Ear canals are intact without mass or lesion.  No erythema or edema is appreciated.  The TMs are intact without fluid. Nose: External evaluation reveals normal support and skin without lesions.  Dorsum is intact.  Anterior rhinoscopy reveals congested mucosa over anterior aspect of inferior turbinates and intact septum.  No purulence noted. Oral:  Oral cavity and oropharynx are intact, symmetric, without erythema or edema.  Mucosa is moist without lesions. Neck: Full range of motion without pain.  There is no significant lymphadenopathy.  No masses palpable.  Thyroid bed within normal limits to palpation.  Parotid glands and submandibular glands  equal bilaterally without mass.  Trachea is midline. Neuro:  CN 2-12 grossly intact.   Assessment: 1.  The patient's acute sinusitis has resolved.  No drainage is noted today. 2.  His septum and turbinates are well-healed.  Plan: 1.  The physical exam findings are reviewed with the patient. 2.  Continue with Flonase nasal spray and nasal saline irrigation. 3.  The patient will return for reevaluation in 6 months.

## 2024-07-21 ENCOUNTER — Ambulatory Visit (INDEPENDENT_AMBULATORY_CARE_PROVIDER_SITE_OTHER): Admitting: Otolaryngology

## 2024-07-21 VITALS — BP 114/75 | HR 81

## 2024-07-21 DIAGNOSIS — J310 Chronic rhinitis: Secondary | ICD-10-CM | POA: Diagnosis not present

## 2024-07-21 DIAGNOSIS — R0981 Nasal congestion: Secondary | ICD-10-CM | POA: Diagnosis not present

## 2024-07-21 DIAGNOSIS — J302 Other seasonal allergic rhinitis: Secondary | ICD-10-CM

## 2024-07-21 DIAGNOSIS — J343 Hypertrophy of nasal turbinates: Secondary | ICD-10-CM | POA: Insufficient documentation

## 2024-07-21 MED ORDER — FLUTICASONE PROPIONATE 50 MCG/ACT NA SUSP: 2.0000 | Freq: Every day | NASAL | 10 refills | Status: AC

## 2024-07-21 NOTE — Progress Notes (Signed)
 Patient ID: Thomas Page, male   DOB: October 11, 1986, 38 y.o.   MRN: [id number]  Follow-up: Chronic rhinosinusitis, polyposis, nasal obstruction   HPI: The patient is a 38 year old male who returns today for his follow-up evaluation.  The patient was previously seen for recurrent sinusitis, sinonasal polyps, and chronic nasal obstruction.  He underwent bilateral endoscopic sinus surgery, septoplasty, and turbinate reduction surgery in August 2022.  At his last visit 6 months ago, no acute infection was noted.  He was continued on Flonase  nasal spray and nasal saline irrigation.  The patient returns today reporting no significant difficulty over the past 6 months.  He has not had any recent sinusitis.  He reports only mild nasal congestion during the allergy season.  Currently he denies any facial pain, fever, or visual change.  Exam: General: Communicates without difficulty, well nourished, no acute distress. Head: Normocephalic, no evidence injury, no tenderness, facial buttresses intact without stepoff. Face/sinus: No tenderness to palpation and percussion. Facial movement is normal and symmetric. Eyes: PERRL, EOMI. No scleral icterus, conjunctivae clear. Neuro: CN II exam reveals vision grossly intact.  No nystagmus at any point of gaze. Ears: Auricles well formed without lesions.  Ear canals are intact without mass or lesion.  No erythema or edema is appreciated.  The TMs are intact without fluid. Nose: External evaluation reveals normal support and skin without lesions.  Dorsum is intact.  Anterior rhinoscopy reveals congested mucosa over anterior aspect of inferior turbinates and intact septum.  No purulence noted. Oral:  Oral cavity and oropharynx are intact, symmetric, without erythema or edema.  Mucosa is moist without lesions. Neck: Full range of motion without pain.  There is no significant lymphadenopathy.  No masses palpable.  Thyroid  bed within normal limits to palpation.  Parotid glands and  submandibular glands equal bilaterally without mass.  Trachea is midline. Neuro:  CN 2-12 grossly intact.   Assessment: 1.  Chronic rhinitis with nasal mucosal congestion.  His sinus openings are patent. 2.  His septum and turbinates are well-healed.  Plan: 1.  The physical exam findings are reviewed with the patient. 2.  Continue with Flonase  nasal spray and nasal saline irrigation. 3.  The patient will return for reevaluation in 6 months.

## 2024-08-31 ENCOUNTER — Other Ambulatory Visit: Payer: Self-pay | Admitting: Family Medicine

## 2025-01-04 ENCOUNTER — Ambulatory Visit (INDEPENDENT_AMBULATORY_CARE_PROVIDER_SITE_OTHER): Admitting: Otolaryngology
